# Patient Record
Sex: Male | Born: 1935
Health system: Southern US, Community
[De-identification: ages and names within clinical notes are randomized; demographics above are authoritative.]

## PROBLEM LIST (undated history)

## (undated) DIAGNOSIS — H9319 Tinnitus, unspecified ear: Secondary | ICD-10-CM

## (undated) DIAGNOSIS — C801 Malignant (primary) neoplasm, unspecified: Secondary | ICD-10-CM

## (undated) DIAGNOSIS — C61 Malignant neoplasm of prostate: Secondary | ICD-10-CM

## (undated) DIAGNOSIS — E785 Hyperlipidemia, unspecified: Secondary | ICD-10-CM

## (undated) HISTORY — DX: Malignant (primary) neoplasm, unspecified: C80.1

## (undated) HISTORY — PX: HERNIA REPAIR: SHX51

## (undated) HISTORY — DX: Malignant neoplasm of prostate: C61

## (undated) HISTORY — DX: Tinnitus, unspecified ear: H93.19

## (undated) HISTORY — DX: Hyperlipidemia, unspecified: E78.5

---
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
## 2008-12-02 ENCOUNTER — Ambulatory Visit: Payer: Self-pay | Admitting: Internal Medicine

## 2009-05-25 ENCOUNTER — Ambulatory Visit: Payer: Self-pay | Admitting: Family Medicine

## 2009-05-25 DIAGNOSIS — L989 Disorder of the skin and subcutaneous tissue, unspecified: Secondary | ICD-10-CM | POA: Insufficient documentation

## 2009-05-25 DIAGNOSIS — N400 Enlarged prostate without lower urinary tract symptoms: Secondary | ICD-10-CM | POA: Insufficient documentation

## 2009-08-13 ENCOUNTER — Ambulatory Visit (HOSPITAL_COMMUNITY): Admission: RE | Admit: 2009-08-13 | Discharge: 2009-08-13 | Payer: Self-pay

## 2009-09-23 ENCOUNTER — Encounter: Payer: Self-pay | Admitting: Family Medicine

## 2009-10-09 ENCOUNTER — Ambulatory Visit: Payer: Self-pay | Admitting: Family Medicine

## 2009-10-23 DIAGNOSIS — C61 Malignant neoplasm of prostate: Secondary | ICD-10-CM | POA: Insufficient documentation

## 2010-03-29 ENCOUNTER — Encounter: Payer: Self-pay | Admitting: Family Medicine

## 2010-05-06 ENCOUNTER — Ambulatory Visit
Admission: RE | Admit: 2010-05-06 | Discharge: 2010-05-06 | Payer: Self-pay | Source: Home / Self Care | Attending: Family Medicine | Admitting: Family Medicine

## 2010-05-06 DIAGNOSIS — M542 Cervicalgia: Secondary | ICD-10-CM | POA: Insufficient documentation

## 2010-05-06 DIAGNOSIS — M79609 Pain in unspecified limb: Secondary | ICD-10-CM | POA: Insufficient documentation

## 2010-05-09 ENCOUNTER — Encounter: Payer: Self-pay | Admitting: Urology

## 2010-05-20 NOTE — Letter (Signed)
Summary: [facility]  [facility]   Imported By: Lanelle Bal 10/21/2009 09:57:16  _____________________________________________________________________  External Attachment:    Type:   Image     Comment:   External Document  Appended Document: [facility]

## 2010-05-20 NOTE — Assessment & Plan Note (Signed)
Summary: URI   Vital Signs:  Patient profile:   77 year old male Height:      70 inches Weight:      151 pounds BMI:     21.74 O2 Sat:      97 % on Room air Temp:     98.7 degrees F oral Pulse rate:   69 / minute BP sitting:   119 / 69  (left arm) Cuff size:   regular  Vitals Entered By: Payton Spark CMA (October 09, 2009 10:16 AM)  O2 Flow:  Room air CC: Watery eyes, scratchy throat, runny nose and cough x 1 week.   Primary Care Provider:  Nani Gasser MD  CC:  Watery eyes, scratchy throat, and runny nose and cough x 1 week.Troy Salinas  History of Present Illness: 77 yo AAM presents for 1 wk of scratchy throat, watery eyes and runny nose.  He has copious clear rhinorrhea  which is now yellow.  No HA.  No fevers or chills.  Has been sneezing and cough which is dry.  No SOB but has some chest tightness.  Has not been taking anything.  Sleeping OK.  No hx of allergies. No ear pressure.  Good energy level.  No rash or GI upset.    Current Medications (verified): 1)  None  Allergies (verified): No Known Drug Allergies  Review of Systems      See HPI  Physical Exam  General:  alert, well-developed, well-nourished, and well-hydrated.   Head:  normocephalic and atraumatic.   Eyes:  eyes slightly watery conjunctiva clear Ears:  EACs patent; TMs translucent and gray with good cone of light and bony landmarks.  Nose:  boggy (almost touching) turbinates with clear rhinorrhea Mouth:  o/p mildly injected Neck:  shotty anterior cervical chain LA Lungs:  dry cough with forced expiratory wheezing nonlabored.  no rhonchi or crackles Heart:  RRR w/o M Extremities:  no LE edema Skin:  color normal.   Psych:  good eye contact, not anxious appearing, and not depressed appearing.     Impression & Recommendations:  Problem # 1:  VIRAL URI (ICD-465.9) Symptoms c/w URI x 7 days, now with some chest tightness and a slight wheeze with cough -- possibly early bronchitis. will treat with 5  days of Zithromax + Chlorpheniramine for congestion/ sneezing/ itchy watery eyes and add Omnaris sample 2 sprays per nostril once a day for post nasal drip and boggy turbinates.  Call if cough/ SOB gets worse or if not feeling better after 7 days.  Complete Medication List: 1)  Zithromax Z-pak 250 Mg Tabs (Azithromycin) .... 2 tabs by mouth x 1 day then  tab by mouth daily 2)  Omnaris 50 Mcg/act Susp (Ciclesonide) .... 2 sprays per nostril once a day  Patient Instructions: 1)  Take OTC CHLORPHENIRAMINE for the next wk for congestion. 2)  (ask pharmacist to find it for you). 3)  Use Omnaris sample 2 sprays in each nostril once a day until it runs out. 4)  Take 5 days of Zithromax to cover for bronchitis. 5)  Call if cough is not improved after 7 days. Prescriptions: ZITHROMAX Z-PAK 250 MG TABS (AZITHROMYCIN) 2 tabs by mouth x 1 day then  tab by mouth daily  #1 pack x 0   Entered and Authorized by:   Seymour Bars DO   Signed by:   Seymour Bars DO on 10/09/2009   Method used:   Electronically to  [facility]  [place] [phone]* (retail)       [address] [place], [place]  [address]       Ph: [phone] or [phone]       Fax: [phone]   RxID:   [id number]
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
## 2010-05-20 NOTE — Letter (Signed)
Summary: [facility]  [facility]   Imported By: Lanelle Bal 04/22/2010 11:22:01  _____________________________________________________________________  External Attachment:    Type:   Image     Comment:   External Document
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
## 2011-04-18 ENCOUNTER — Ambulatory Visit (INDEPENDENT_AMBULATORY_CARE_PROVIDER_SITE_OTHER): Payer: Medicare Other | Admitting: Family Medicine

## 2011-04-18 ENCOUNTER — Encounter: Payer: Self-pay | Admitting: Family Medicine

## 2011-04-18 ENCOUNTER — Ambulatory Visit
Admission: RE | Admit: 2011-04-18 | Discharge: 2011-04-18 | Disposition: A | Discharge status: Home / Self Care | Payer: BC Managed Care – PPO | Source: Ambulatory Visit | Attending: Family Medicine | Admitting: Family Medicine

## 2011-04-18 VITALS — BP 132/77 | HR 68 | Temp 97.7°F | Ht 70.0 in | Wt 158.0 lb

## 2011-04-18 DIAGNOSIS — M25552 Pain in left hip: Secondary | ICD-10-CM

## 2011-04-18 DIAGNOSIS — M25559 Pain in unspecified hip: Secondary | ICD-10-CM

## 2011-04-18 MED ORDER — CELECOXIB 200 MG PO CAPS: 200.0000 mg | ORAL_CAPSULE | Freq: Every day | ORAL | Status: DC

## 2011-04-18 NOTE — Patient Instructions (Signed)
Hip pain may take medication dHip Pain The hips join the upper legs to the lower pelvis. The bones, cartilage, tendons, and muscles of the hip joint perform a lot of work each day holding your body weight and allowing you to move around. Hip pain is a common symptom. It can range from a minor ache to severe pain on 1 or both hips. Pain may be felt on the inside of the hip joint near the groin, or the outside near the buttocks and upper thigh. There may be swelling or stiffness as well. It occurs more often when a person walks or performs activity. There are many reasons hip pain can develop. CAUSES  It is important to work with your caregiver to identify the cause since many conditions can impact the bones, cartilage, muscles, and tendons of the hips. Causes for hip pain include:  Broken (fractured) bones.   Separation of the thighbone from the hip socket (dislocation).   Torn cartilage of the hip joint.   Swelling (inflammation) of a tendon (tendonitis), the sac within the hip joint (bursitis), or a joint.   A weakening in the abdominal wall (hernia), affecting the nerves to the hip.   Arthritis in the hip joint or lining of the hip joint.   Pinched nerves in the back, hip, or upper thigh.   A bulging disc in the spine (herniated disc).   Rarely, bone infection or cancer.  DIAGNOSIS  The location of your hip pain will help your caregiver understand what may be causing the pain. A diagnosis is based on your medical history, your symptoms, results from your physical exam, and results from diagnostic tests. Diagnostic tests may include X-ray exams, a computerized magnetic scan (magnetic resonance imaging, MRI), or bone scan. TREATMENT  Treatment will depend on the cause of your hip pain. Treatment may include:  Limiting activities and resting until symptoms improve.   Crutches or other walking supports (a cane or brace).   Ice, elevation, and compression.   Physical therapy or home  exercises.   Shoe inserts or special shoes.   Losing weight.   Medications to reduce pain.   Undergoing surgery.  HOME CARE INSTRUCTIONS   Only take over-the-counter or prescription medicines for pain, discomfort, or fever as directed by your caregiver.   Put ice on the injured area:   Put ice in a plastic bag.   Place a towel between your skin and the bag.   Leave the ice on for 15 to 20 minutes at a time, 3 to 4 times a day.   Keep your leg raised (elevated) when possible to lessen swelling.   Avoid activities that cause pain.   Follow specific exercises as directed by your caregiver.   Sleep with a pillow between your legs on your most comfortable side.   Record how often you have hip pain, the location of the pain, and what it feels like. This information may be helpful to you and your caregiver.   Ask your caregiver about returning to work or sports and whether you should drive.   Follow up with your caregiver for further exams, therapy, or testing as directed.  SEEK MEDICAL CARE IF:   Your pain or swelling continues or worsens after 1 week.   You are feeling unwell or have chills.   You have increasing difficulty with walking.   You have a loss of sensation or other new symptoms.   You have questions or concerns.  SEEK IMMEDIATE MEDICAL  CARE IF:   You cannot put weight on the affected hip.   You have fallen.   You have a sudden increase in pain and swelling in your hip.   You have a fever.  MAKE SURE YOU:   Understand these instructions.   Will watch your condition.   Will get help right away if you are not doing well or get worse.  Document Released: 09/22/2009 Document Revised: 12/15/2010 Document Reviewed: 09/22/2009 [facility] Patient Information 2012 [place], [place] for 2 weeks and then use prn. May want to ice the hip for 20 minutes out of 2 hours after playing tennis.hip

## 2011-04-18 NOTE — Progress Notes (Signed)
Subjective:     Patient ID: Troy Salinas, male   DOB: 01/08/1932, 77 y.o.   MRN: [id number]  Hip Pain  The incident occurred more than 1 week ago (off and on greater than 2 years. Initial was w/exercise but now tender even when not playing.). Incident location: on the tennis court. There was no injury mechanism. The pain is present in the left hip. Quality: sornesss. The pain is at a severity of 7/10. The pain is moderate. The pain has been fluctuating since onset. Pertinent negatives include no inability to bear weight, loss of motion, loss of sensation or tingling. The symptoms are aggravated by movement and weight bearing. He has tried nothing for the symptoms.     Review of Systems  Musculoskeletal: Positive for back pain. Negative for myalgias and arthralgias.  Neurological: Negative for tingling.       Objective:   Physical Exam  Constitutional: He is oriented to person, place, and time. He appears well-developed and well-nourished.  Musculoskeletal: Normal range of motion. He exhibits no edema.  Neurological: He is alert and oriented to person, place, and time.  Skin: Skin is warm.  Psychiatric: He has a normal mood and affect.       Assessment:     L hip pain    Plan:     Ice the hip 20 minutes out of 2hrs  after working out. Do not take Alleve or Motrin w/ Celebrex.  samples of Celebrex given as well as a scrip and return in 2 weeks for follow up as needed. Xray of the L  hip also ordered

## 2011-04-19 HISTORY — PX: COLONOSCOPY: SHX174

## 2011-05-16 ENCOUNTER — Encounter: Payer: Self-pay | Admitting: Physician Assistant

## 2011-05-16 ENCOUNTER — Ambulatory Visit (INDEPENDENT_AMBULATORY_CARE_PROVIDER_SITE_OTHER): Payer: Medicare Other | Admitting: Physician Assistant

## 2011-05-16 VITALS — BP 131/63 | HR 58 | Temp 97.9°F | Ht 69.0 in | Wt 163.0 lb

## 2011-05-16 DIAGNOSIS — Z0000 Encounter for general adult medical examination without abnormal findings: Secondary | ICD-10-CM

## 2011-05-16 DIAGNOSIS — Z13220 Encounter for screening for lipoid disorders: Secondary | ICD-10-CM

## 2011-05-16 DIAGNOSIS — N400 Enlarged prostate without lower urinary tract symptoms: Secondary | ICD-10-CM

## 2011-05-16 DIAGNOSIS — Z125 Encounter for screening for malignant neoplasm of prostate: Secondary | ICD-10-CM

## 2011-05-16 DIAGNOSIS — Z23 Encounter for immunization: Secondary | ICD-10-CM

## 2011-05-16 DIAGNOSIS — Z111 Encounter for screening for respiratory tuberculosis: Secondary | ICD-10-CM

## 2011-05-16 NOTE — Patient Instructions (Signed)
Get labs drawn today for cholestrol and prostate. Will call with results. Come back on Wednesday to have PPD read. Follow up in 1 year.

## 2011-05-16 NOTE — Progress Notes (Signed)
  Subjective:    Patient ID: Troy Salinas, male    DOB: 10-04-1931, 78 y.o.   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 He denies any problems or concerns today. He has a history of hypertrophic prostate without any symptoms.  He needs a PPD. He declines pneumonia and shingles vaccine.   Review of Systems     Objective:   Physical Exam  Constitutional: He is oriented to person, place, and time. He appears well-developed and well-nourished.  HENT:  Head: Normocephalic and atraumatic.  Right Ear: External ear normal.  Left Ear: External ear normal.  Nose: Nose normal.  Mouth/Throat: Oropharynx is clear and moist.       Gross hearing intact.   Eyes: Conjunctivae and EOM are normal. Pupils are equal, round, and reactive to light.  Neck: Normal range of motion. Neck supple.  Cardiovascular: Regular rhythm, normal heart sounds and intact distal pulses.        Bradycardia at 58.  Pulmonary/Chest: Effort normal and breath sounds normal.  Abdominal: Soft. Bowel sounds are normal.  Genitourinary: Rectum normal and penis normal. Guaiac negative stool. No penile tenderness.       Enlarged smooth prostate.  Musculoskeletal: Normal range of motion.       Strength in all extremities 5/5. Able to squat and twist with out problems.   Neurological: He is alert and oriented to person, place, and time. He has normal reflexes.  Skin: Skin is warm and dry.  Psychiatric: He has a normal mood and affect. His behavior is normal.          Assessment & Plan:   V70.0-Hemmocult negative. PPD is administered. Patient will come back on Wednesday to have her. Lipid panel and PSA ordered. We'll call with results. Patient educated on importance of healthy diet and staying active.

## 2011-05-18 LAB — LIPID PANEL
HDL: 48 mg/dL (ref >39)
LDL Cholesterol: 111 mg/dL — ABNORMAL HIGH (ref 0–99)
VLDL: 22 mg/dL (ref 0–40)

## 2011-05-19 LAB — TB SKIN TEST: TB Skin Test: NEGATIVE mm

## 2011-09-20 ENCOUNTER — Ambulatory Visit (INDEPENDENT_AMBULATORY_CARE_PROVIDER_SITE_OTHER): Payer: Medicare Other | Admitting: Family Medicine

## 2011-09-20 ENCOUNTER — Encounter: Payer: Self-pay | Admitting: Family Medicine

## 2011-09-20 VITALS — BP 125/69 | HR 82 | Ht 70.0 in | Wt 155.0 lb

## 2011-09-20 DIAGNOSIS — I10 Essential (primary) hypertension: Secondary | ICD-10-CM

## 2011-09-20 DIAGNOSIS — E785 Hyperlipidemia, unspecified: Secondary | ICD-10-CM

## 2011-09-20 NOTE — Patient Instructions (Signed)

## 2011-09-20 NOTE — Progress Notes (Signed)
  Subjective:    Patient ID: Troy Salinas, male    DOB: Apr 14, 1932, 78 y.o.   MRN: [id number]  HPI  He is here today because he would like to recheck his blood pressure. He would also like to recheck his cholesterol. He is a physical back in January and his LDL was slightly elevated at 111. He has no prior history of high blood pressure or family history of coronary artery disease. He is not a smoker. He does drink a glass of wine daily. No SOB orCP.    Review of Systems     Objective:   Physical Exam  Constitutional: He is oriented to person, place, and time. He appears well-developed and well-nourished.  HENT:  Head: Normocephalic and atraumatic.  Cardiovascular: Normal rate, regular rhythm and normal heart sounds.   Pulmonary/Chest: Effort normal and breath sounds normal.  Neurological: He is alert and oriented to person, place, and time.  Skin: Skin is warm and dry.  Psychiatric: He has a normal mood and affect. His behavior is normal.          Assessment & Plan:  Hyperlpidemia -  we did discuss dietary changes to improve his cholesterol. He is overall low risk, his LDL goal is less than 1:30. I gave him a lab slip today so that he can go next few weeks to recheck his levels.Given H.O on dietary infromation. Stay active and exercise reguarly.   I gave him reassurance that his blood pressure is normal today.

## 2013-05-07 DIAGNOSIS — N4889 Other specified disorders of penis: Secondary | ICD-10-CM | POA: Diagnosis not present

## 2013-05-28 DIAGNOSIS — N471 Phimosis: Secondary | ICD-10-CM | POA: Diagnosis not present

## 2013-05-28 DIAGNOSIS — N478 Other disorders of prepuce: Secondary | ICD-10-CM | POA: Diagnosis not present

## 2013-08-01 DIAGNOSIS — H6120 Impacted cerumen, unspecified ear: Secondary | ICD-10-CM | POA: Diagnosis not present

## 2013-08-01 DIAGNOSIS — J342 Deviated nasal septum: Secondary | ICD-10-CM | POA: Diagnosis not present

## 2013-08-26 DIAGNOSIS — Z8546 Personal history of malignant neoplasm of prostate: Secondary | ICD-10-CM | POA: Diagnosis not present

## 2013-09-02 DIAGNOSIS — Z8546 Personal history of malignant neoplasm of prostate: Secondary | ICD-10-CM | POA: Diagnosis not present

## 2013-09-24 DIAGNOSIS — H40019 Open angle with borderline findings, low risk, unspecified eye: Secondary | ICD-10-CM | POA: Diagnosis not present

## 2013-09-24 DIAGNOSIS — H2510 Age-related nuclear cataract, unspecified eye: Secondary | ICD-10-CM | POA: Diagnosis not present

## 2013-09-24 DIAGNOSIS — H35349 Macular cyst, hole, or pseudohole, unspecified eye: Secondary | ICD-10-CM | POA: Diagnosis not present

## 2013-09-24 DIAGNOSIS — H3589 Other specified retinal disorders: Secondary | ICD-10-CM | POA: Diagnosis not present

## 2013-09-27 DIAGNOSIS — I440 Atrioventricular block, first degree: Secondary | ICD-10-CM | POA: Diagnosis not present

## 2013-09-27 DIAGNOSIS — I951 Orthostatic hypotension: Secondary | ICD-10-CM | POA: Diagnosis not present

## 2013-09-27 DIAGNOSIS — R55 Syncope and collapse: Secondary | ICD-10-CM | POA: Diagnosis not present

## 2013-10-17 DIAGNOSIS — C61 Malignant neoplasm of prostate: Secondary | ICD-10-CM | POA: Diagnosis not present

## 2013-10-17 DIAGNOSIS — R972 Elevated prostate specific antigen [PSA]: Secondary | ICD-10-CM | POA: Diagnosis not present

## 2013-12-31 DIAGNOSIS — C61 Malignant neoplasm of prostate: Secondary | ICD-10-CM | POA: Diagnosis not present

## 2014-01-03 DIAGNOSIS — I951 Orthostatic hypotension: Secondary | ICD-10-CM | POA: Diagnosis not present

## 2014-01-03 DIAGNOSIS — C761 Malignant neoplasm of thorax: Secondary | ICD-10-CM | POA: Diagnosis not present

## 2014-01-03 DIAGNOSIS — I440 Atrioventricular block, first degree: Secondary | ICD-10-CM | POA: Diagnosis not present

## 2014-01-03 DIAGNOSIS — R748 Abnormal levels of other serum enzymes: Secondary | ICD-10-CM | POA: Diagnosis not present

## 2014-02-21 DIAGNOSIS — Z23 Encounter for immunization: Secondary | ICD-10-CM | POA: Diagnosis not present

## 2014-02-27 DIAGNOSIS — K635 Polyp of colon: Secondary | ICD-10-CM | POA: Diagnosis not present

## 2014-02-27 DIAGNOSIS — K648 Other hemorrhoids: Secondary | ICD-10-CM | POA: Diagnosis not present

## 2014-02-27 DIAGNOSIS — Z1211 Encounter for screening for malignant neoplasm of colon: Secondary | ICD-10-CM | POA: Diagnosis not present

## 2014-02-27 DIAGNOSIS — D124 Benign neoplasm of descending colon: Secondary | ICD-10-CM | POA: Diagnosis not present

## 2014-02-27 DIAGNOSIS — D125 Benign neoplasm of sigmoid colon: Secondary | ICD-10-CM | POA: Diagnosis not present

## 2014-03-12 DIAGNOSIS — M25579 Pain in unspecified ankle and joints of unspecified foot: Secondary | ICD-10-CM | POA: Diagnosis not present

## 2014-04-30 DIAGNOSIS — C61 Malignant neoplasm of prostate: Secondary | ICD-10-CM | POA: Diagnosis not present

## 2014-06-20 DIAGNOSIS — R0982 Postnasal drip: Secondary | ICD-10-CM | POA: Diagnosis not present

## 2014-06-20 DIAGNOSIS — H9313 Tinnitus, bilateral: Secondary | ICD-10-CM | POA: Diagnosis not present

## 2014-06-20 DIAGNOSIS — H6123 Impacted cerumen, bilateral: Secondary | ICD-10-CM | POA: Diagnosis not present

## 2014-09-05 ENCOUNTER — Ambulatory Visit (INDEPENDENT_AMBULATORY_CARE_PROVIDER_SITE_OTHER): Payer: Medicare Other | Admitting: Internal Medicine

## 2014-09-05 ENCOUNTER — Encounter: Payer: Self-pay | Admitting: Internal Medicine

## 2014-09-05 VITALS — BP 144/56 | HR 53 | Ht 70.0 in | Wt 155.8 lb

## 2014-09-05 DIAGNOSIS — R0602 Shortness of breath: Secondary | ICD-10-CM | POA: Insufficient documentation

## 2014-09-05 DIAGNOSIS — I498 Other specified cardiac arrhythmias: Secondary | ICD-10-CM | POA: Diagnosis not present

## 2014-09-05 DIAGNOSIS — Z13220 Encounter for screening for lipoid disorders: Secondary | ICD-10-CM | POA: Diagnosis not present

## 2014-09-05 DIAGNOSIS — R42 Dizziness and giddiness: Secondary | ICD-10-CM

## 2014-09-05 LAB — LIPID PANEL
Cholesterol: 147 mg/dL (ref 0–200)
HDL: 46 mg/dL (ref >=40)
LDL CALC: 84 mg/dL (ref 0–99)
Total CHOL/HDL Ratio: 3.2 Ratio
Triglycerides: 85 mg/dL (ref <150)
VLDL: 17 mg/dL (ref 0–40)

## 2014-09-05 LAB — COMPREHENSIVE METABOLIC PANEL
ALT: 15 U/L (ref 0–53)
AST: 19 U/L (ref 0–37)
Albumin: 3.8 g/dL (ref 3.5–5.2)
Alkaline Phosphatase: 121 U/L — ABNORMAL HIGH (ref 39–117)
BUN: 16 mg/dL (ref 6–23)
CALCIUM: 8.5 mg/dL (ref 8.4–10.5)
CHLORIDE: 105 meq/L (ref 96–112)
CO2: 30 mEq/L (ref 19–32)
CREATININE: 0.81 mg/dL (ref 0.50–1.35)
Glucose, Bld: 81 mg/dL (ref 70–99)
Potassium: 4.2 mEq/L (ref 3.5–5.3)
Sodium: 141 mEq/L (ref 135–145)
Total Bilirubin: 1.6 mg/dL — ABNORMAL HIGH (ref 0.2–1.2)
Total Protein: 6.4 g/dL (ref 6.0–8.3)

## 2014-09-05 NOTE — Progress Notes (Signed)
OFFICE NOTE  Chief Complaint:  Lightheadedness, "pounding in my throat"  Primary Care Physician: No primary care provider on file.  HPI:  Troy Salinas is a pleasant 81 year old male who self-referred for a second opinion regarding his symptoms. He's previously seen Dr. Einar Salinas, most recently last year. He underwent an exercise stress test as well as an echocardiogram in the past. The stress test was reportedly normal. The echocardiogram report demonstrates mild LVH and normal systolic function with an EF of 57%. No comment was made on diastolic function. The left atrium was slightly enlarged and the right atrium and right ventricle were borderline enlarged. There is mild MR and TR. He has no history of hypertension although blood pressure is borderline elevated today. He says he gets symptoms such as a pulsating or pounding feeling in his throat when he gets upset. He also is fairly active and plays tennis regularly. He says he gets some lightheadedness but is felt that it is related to not eating before exercising. He also reports his heart races, again when he gets upset. In the past is been reported his EKGs demonstrated sinus rhythm with a first-degree AV block. EKG today, however, demonstrates junctional rhythm at 53. He is really not describing any cardiac chest pain with exertion.  PMHx:  Past Medical History  Diagnosis Date  . Cancer     Prostate, managed in [place]  . Hyperlipidemia     Past Surgical History  Procedure Laterality Date  . Hernia repair      FAMHx:  Family History  Problem Relation Age of Onset  . Cancer Maternal Grandmother     SOCHx:   reports that he has quit smoking. He has never used smokeless tobacco. He reports that he drinks alcohol. He reports that he does not use illicit drugs.  ALLERGIES:  No Known Allergies  ROS: A comprehensive review of systems was negative except for: Cardiovascular: positive for palpitations Neurological: positive for  Lightheadedness  HOME MEDS: Current Outpatient Prescriptions  Medication Sig Dispense Refill  . Multiple Vitamin (MULTIVITAMIN) capsule Take 1 capsule by mouth daily.     No current facility-administered medications for this visit.    LABS/IMAGING: No results found for this or any previous visit (from the past 48 hour(s)). No results found.  WEIGHTS: Wt Readings from Last 3 Encounters:  09/05/14 155 lb 12.8 oz (70.67 kg)  09/20/11 155 lb (70.308 kg)  05/16/11 163 lb (73.936 kg)    VITALS: BP 144/56 mmHg  Pulse 53  Ht 5\' 10"  (1.778 m)  Wt 155 lb 12.8 oz (70.67 kg)  BMI 22.35 kg/m2  EXAM: General appearance: alert and no distress Neck: no carotid bruit, no JVD and thyroid not enlarged, symmetric, no tenderness/mass/nodules Lungs: clear to auscultation bilaterally Heart: Regular bradycardia Abdomen: soft, non-tender; bowel sounds normal; no masses,  no organomegaly Extremities: extremities normal, atraumatic, no cyanosis or edema Pulses: 2+ and symmetric Skin: Skin color, texture, turgor normal. No rashes or lesions Neurologic: Grossly normal Psych: Pleasant  EKG: Junctional rhythm at 53  ASSESSMENT: 1. Occasional lightheadedness and history of orthostasis 2. Newly diagnosed junctional rhythm 3. Dyslipidemia 4. History of prostate cancer 5. Possible episodic hypertension  PLAN: 1.   Mr. Troy Salinas has a newly recognized junctional rhythm with heart rate in the 50s. He is not on any medications. This could explain possibly why he has intermittent fatigue and some lightheadedness. His symptoms are very sporadic therefore may be difficult to determine whether he is having any  worsening bradycardia or at what point he may need a pacemaker. Certainly, I communicated with him if he were to have any presyncope or syncopal episodes or feel worsening fatigue or decreased exercise tolerance then we may need to monitor him to determine if a pacemaker will be necessary. I do think it  would be reasonable to repeat an exercise tolerance test. It's been at least a couple years since his last test and it would be more helpful to see if he has any evidence for chronotropic incompetence. His echocardiogram is fairly reassuring showing normal systolic function with mild valvular disease. We will go ahead and recheck a lipid profile as he is not currently on therapy. Finally, his EKG shows changes of hypertensive heart disease. I believe his episodes of pounding in his throat may be related to episodic hypertension. If his blood pressure remains elevated as it was in the office today, I may recommend starting blood pressure medication such as an ACE inhibitor or ARB.  Plan to see him back in a few weeks to discuss the findings of his stress test and go over his blood work.  Pixie Casino, MD, [facility] Attending Cardiologist [place] 09/05/2014, 1:30 PM

## 2014-09-05 NOTE — Patient Instructions (Signed)
Your physician recommends that you return for lab work FASTING - first floor - [address]   Dr. Debara Pickett has ordered an exercise tolerance test.    Your physician recommends that you schedule a follow-up appointment after your tests.    Exercise Stress Electrocardiogram An exercise stress electrocardiogram is a test that is done to evaluate the blood supply to your heart. This test may also be called exercise stress electrocardiography. The test is done while you are walking on a treadmill. The goal of this test is to raise your heart rate. This test is done to find areas of poor blood flow to the heart by determining the extent of coronary artery disease (CAD).   CAD is defined as narrowing in one or more heart (coronary) arteries of more than 70%. If you have an abnormal test result, this may mean that you are not getting adequate blood flow to your heart during exercise. Additional testing may be needed to understand why your test was abnormal. LET [facility] CARE PROVIDER KNOW ABOUT:   Any allergies you have.  All medicines you are taking, including vitamins, herbs, eye drops, creams, and over-the-counter medicines.  Previous problems you or members of your family have had with the use of anesthetics.  Any blood disorders you have.  Previous surgeries you have had.  Medical conditions you have.  Possibility of pregnancy, if this applies. RISKS AND COMPLICATIONS Generally, this is a safe procedure. However, as with any procedure, complications can occur. Possible complications can include:  Pain or pressure in the following areas:  Chest.  Jaw or neck.  Between your shoulder blades.  Radiating down your left arm.  Dizziness or light-headedness.  Shortness of breath.  Increased or irregular heartbeats.  Nausea or vomiting.  Heart attack (rare). BEFORE THE PROCEDURE  Avoid all forms of caffeine 24 hours before your test or as directed by your health care provider. This  includes coffee, tea (even decaffeinated tea), caffeinated sodas, chocolate, cocoa, and certain pain medicines.  Follow your health care provider's instructions regarding eating and drinking before the test.  Take your medicines as directed at regular times with water unless instructed otherwise. Exceptions may include:  If you have diabetes, ask how you are to take your insulin or pills. It is common to adjust insulin dosing the morning of the test.  If you are taking beta-blocker medicines, it is important to talk to your health care provider about these medicines well before the date of your test. Taking beta-blocker medicines may interfere with the test. In some cases, these medicines need to be changed or stopped 24 hours or more before the test.  If you wear a nitroglycerin patch, it may need to be removed prior to the test. Ask your health care provider if the patch should be removed before the test.  If you use an inhaler for any breathing condition, bring it with you to the test.  If you are an outpatient, bring a snack so you can eat right after the stress phase of the test.  Do not smoke for 4 hours prior to the test or as directed by your health care provider.  Do not apply lotions, powders, creams, or oils on your chest prior to the test.  Wear loose-fitting clothes and comfortable shoes for the test. This test involves walking on a treadmill. PROCEDURE  Multiple patches (electrodes) will be put on your chest. If needed, small areas of your chest may have to be shaved  to get better contact with the electrodes. Once the electrodes are attached to your body, multiple wires will be attached to the electrodes and your heart rate will be monitored.  Your heart will be monitored both at rest and while exercising.  You will walk on a treadmill. The treadmill will be started at a slow pace. The treadmill speed and incline will gradually be increased to raise your heart rate. AFTER  THE PROCEDURE  Your heart rate and blood pressure will be monitored after the test.  You may return to your normal schedule including diet, activities, and medicines, unless your health care provider tells you otherwise. Document Released: 04/01/2000 Document Revised: 04/09/2013 Document Reviewed: 12/10/2012 [facility] Patient Information 2015 [place], [place]. This information is not intended to replace advice given to you by your health care provider. Make sure you discuss any questions you have with your health care provider.

## 2014-10-07 ENCOUNTER — Telehealth (HOSPITAL_COMMUNITY): Payer: Self-pay

## 2014-10-07 NOTE — Telephone Encounter (Signed)
Encounter complete. 

## 2014-10-09 ENCOUNTER — Ambulatory Visit (HOSPITAL_COMMUNITY)
Admission: RE | Admit: 2014-10-09 | Discharge: 2014-10-09 | Disposition: A | Discharge status: Home / Self Care | Payer: Medicare Other | Source: Ambulatory Visit | Attending: Internal Medicine | Admitting: Internal Medicine

## 2014-10-09 DIAGNOSIS — I498 Other specified cardiac arrhythmias: Secondary | ICD-10-CM | POA: Diagnosis not present

## 2014-10-09 DIAGNOSIS — R42 Dizziness and giddiness: Secondary | ICD-10-CM | POA: Diagnosis not present

## 2014-10-09 DIAGNOSIS — R0602 Shortness of breath: Secondary | ICD-10-CM | POA: Diagnosis not present

## 2014-10-09 LAB — EXERCISE TOLERANCE TEST
CSEPED: 10 min
CSEPEDS: 0 s
CSEPPHR: 151 {beats}/min
Estimated workload: 11.7 METS
MPHR: 140 {beats}/min
Percent HR: 107 %
RPE: 15
Rest HR: 69 {beats}/min

## 2014-11-24 DIAGNOSIS — H2512 Age-related nuclear cataract, left eye: Secondary | ICD-10-CM | POA: Diagnosis not present

## 2014-11-24 DIAGNOSIS — H40003 Preglaucoma, unspecified, bilateral: Secondary | ICD-10-CM | POA: Diagnosis not present

## 2014-11-24 DIAGNOSIS — H25011 Cortical age-related cataract, right eye: Secondary | ICD-10-CM | POA: Diagnosis not present

## 2014-11-24 DIAGNOSIS — H2511 Age-related nuclear cataract, right eye: Secondary | ICD-10-CM | POA: Diagnosis not present

## 2014-11-26 ENCOUNTER — Ambulatory Visit (INDEPENDENT_AMBULATORY_CARE_PROVIDER_SITE_OTHER): Payer: Medicare Other | Admitting: Family

## 2014-11-26 ENCOUNTER — Encounter: Payer: Self-pay | Admitting: Family

## 2014-11-26 VITALS — BP 150/74 | HR 52 | Temp 97.9°F | Resp 18 | Ht 70.0 in | Wt 151.0 lb

## 2014-11-26 DIAGNOSIS — L988 Other specified disorders of the skin and subcutaneous tissue: Secondary | ICD-10-CM | POA: Diagnosis not present

## 2014-11-26 DIAGNOSIS — H269 Unspecified cataract: Secondary | ICD-10-CM | POA: Insufficient documentation

## 2014-11-26 NOTE — Progress Notes (Addendum)
Subjective:    Patient ID: Troy Salinas, male    DOB: August 07, 1931, 81 y.o.   MRN: [id number]  Chief Complaint  Patient presents with  . Establish Care    spots on skin    HPI:  Troy Salinas is a 81 y.o. male with a PMH of prostate cancer and hyperlipidemia who presents today for an office visit to establish care.    1.) Skin - Associated symptom of spots located on his skin around his body and head, neck and arm have been going on for quite a while. Notes that he perspires a lot while playing tennis. Described as similar to white/blackheads. Modifying factors include lancing them and having some white discharge. There is no pain associated with it.   2.) Eyes - Associated symptom of eye dryness located in both eyes has been going on for a while. Denies any changes in vision. Symptoms are worse at night. Has been told in the past that he has cataracts and possible glaucoma.   No Known Allergies   Outpatient Prescriptions Prior to Visit  Medication Sig Dispense Refill  . Multiple Vitamin (MULTIVITAMIN) capsule Take 1 capsule by mouth daily.     No facility-administered medications prior to visit.     Past Medical History  Diagnosis Date  . Cancer     Prostate, managed in [place]  . Hyperlipidemia      Past Surgical History  Procedure Laterality Date  . Hernia repair       Family History  Problem Relation Age of Onset  . Cancer Maternal Grandmother   . Healthy Mother   . Cataracts Mother   . Cancer Sister   . Cancer Brother   . Healthy Maternal Grandfather   . Healthy Paternal Grandmother   . Healthy Paternal Grandfather     Social History   Social History  . Marital Status: Widowed    Spouse Name: N/A  . Number of Children: 1  . Years of Education: 16   Occupational History  . Retired    Social History Main Topics  . Smoking status: Former Research scientist (life sciences)  . Smokeless tobacco: Never Used  . Alcohol Use: 0.0 oz/week    0 Standard drinks or equivalent per week      Comment: social use  . Drug Use: No  . Sexual Activity: Not on file   Other Topics Concern  . Not on file   Social History Narrative   Fun: Plays tennis   Feels safe at home and denies abuse    Review of Systems  Constitutional: Negative for fever and chills.  Eyes: Negative for photophobia, pain, discharge, redness, itching and visual disturbance.  Skin: Positive for rash. Negative for color change.  Neurological: Negative for headaches.      Objective:    BP 150/74 mmHg  Pulse 52  Temp(Src) 97.9 F (36.6 C) (Oral)  Resp 18  Ht 5\' 10"  (1.778 m)  Wt 151 lb (68.493 kg)  BMI 21.67 kg/m2  SpO2 98% Nursing note and vital signs reviewed.  Physical Exam  Constitutional: He is oriented to person, place, and time. He appears well-developed and well-nourished. No distress.  Eyes:  Fundoscopic exam:      The right eye shows no exudate, no hemorrhage and no papilledema. The right eye shows red reflex.       The left eye shows no exudate, no hemorrhage and no papilledema. The left eye shows red reflex.  Cardiovascular: Normal rate, regular rhythm, normal heart  sounds and intact distal pulses.   Pulmonary/Chest: Effort normal and breath sounds normal.  Neurological: He is alert and oriented to person, place, and time.  Skin: Skin is warm and dry.     Psychiatric: He has a normal mood and affect. His behavior is normal. Judgment and thought content normal.       Assessment & Plan:   Problem List Items Addressed This Visit      Musculoskeletal and Integument   Skin macule    Patches appear as potential solar lentigines, however cannot rule out underlying melanoma although appearance makes that seem unlikely. Refer to dermatology for confirmation. Recommend hat and sun protection when outdoors.           Other   Cataracts, bilateral - Primary    Previously diagnosed with cataracts and questionable glaucoma. Would like to be referred for potential glaucoma. Referral to  opthalmology placed.       Relevant Orders   Ambulatory referral to Ophthalmology

## 2014-11-26 NOTE — Assessment & Plan Note (Signed)
Previously diagnosed with cataracts and questionable glaucoma. Would like to be referred for potential glaucoma. Referral to opthalmology placed.

## 2014-11-26 NOTE — Progress Notes (Signed)
Pre visit review using our clinic review tool, if applicable. No additional management support is needed unless otherwise documented below in the visit note. 

## 2014-11-26 NOTE — Progress Notes (Deleted)
Subjective:    Patient ID: Troy Salinas, male    DOB: 08/16/1931, 81 y.o.   MRN: [id number]  Chief Complaint  Patient presents with  . Establish Care    CPE, fasting     HPI:  Troy Salinas is a 81 y.o. male who presents today for an annual wellness visit.   1) Health Maintenance -   Diet -  Exercise -  2) Preventative Exams / Immunizations:  Dental -- Vision --   Health Maintenance  Topic Date Due  . PNA vac Low Risk Adult (1 of 2 - PCV13) 02/07/1999  . INFLUENZA VACCINE  11/17/2014  . COLONOSCOPY  11/12/2017  . TETANUS/TDAP  04/19/2019  . ZOSTAVAX  Addressed     Immunization History  Administered Date(s) Administered  . Influenza Whole 01/16/2009  . PPD Test 05/16/2011  . Td 04/18/2009     RISK FACTORS  Tobacco History  Smoking status  . Former Smoker  Smokeless tobacco  . Never Used     Cardiac risk factors: {risk factors:510}.  Depression Screen  Q1: Over the past two weeks, have you felt down, depressed or hopeless? No  Q2: Over the past two weeks, have you felt little interest or pleasure in doing things? No  Have you lost interest or pleasure in daily life? No  Do you often feel hopeless? No  Do you cry easily over simple problems? No  Activities of Daily Living In your present state of health, do you have any difficulty performing the following activities?:  Driving? No Managing money?  No Feeding yourself? No Getting from bed to chair? No Climbing a flight of stairs? No Preparing food and eating?: No Bathing or showering? No Getting dressed: No Getting to the toilet? No Using the toilet: No Moving around from place to place: No In the past year have you fallen or had a near fall?:No   Home Safety Has smoke detector and wears seat belts. No firearms. No excess sun exposure. Are there smokers in your home (other than you)?  No Do you feel safe at home?  Yes  Hearing Difficulties: No Do you often ask people to speak up or  repeat themselves? No Do you experience ringing or noises in your ears? No  Do you have difficulty understanding soft or whispered voices? No    Cognitive Testing  Alert? Yes   Normal Appearance? Yes  Oriented to person? Yes  Place? Yes   Time? Yes  Recall of three objects?  Yes  Can perform simple calculations? Yes  Displays appropriate judgment? Yes  Can read the correct time from a watch face? Yes  Do you feel that you have a problem with memory? No  Do you often misplace items? No   Advanced Directives have been discussed with the patient? {yes/no:20286}  Current Physicians/Providers and Suppliers  1.   Indicate any recent Medical Services you may have received from other than [facility] providers in the past year (date may be approximate).  All answers were reviewed with the patient and necessary referrals were made:  Mauricio Po, [place]   11/26/2014    Review of Systems  Constitutional: Denies fever, chills, fatigue, or significant weight gain/loss. HENT: Head: Denies headache or neck pain Ears: Denies changes in hearing, ringing in ears, earache, drainage Nose: Denies discharge, stuffiness, itching, nosebleed, sinus pain Throat: Denies sore throat, hoarseness, dry mouth, sores, thrush Eyes: Denies loss/changes in vision, pain, redness, blurry/double vision, flashing lights Cardiovascular: Denies chest pain/discomfort, tightness,  palpitations, shortness of breath with activity, difficulty lying down, swelling, sudden awakening with shortness of breath Respiratory: Denies shortness of breath, cough, sputum production, wheezing Gastrointestinal: Denies dysphasia, heartburn, change in appetite, nausea, change in bowel habits, rectal bleeding, constipation, diarrhea, yellow skin or eyes Genitourinary: Denies frequency, urgency, burning/pain, blood in urine, incontinence, change in urinary strength. Musculoskeletal: Denies muscle/joint pain, stiffness, back pain, redness or  swelling of joints, trauma Skin: Denies rashes, lumps, itching, dryness, color changes, or hair/nail changes Neurological: Denies dizziness, fainting, seizures, weakness, numbness, tingling, tremor Psychiatric - Denies nervousness, stress, depression or memory loss Endocrine: Denies heat or cold intolerance, sweating, frequent urination, excessive thirst, changes in appetite Hematologic: Denies ease of bruising or bleeding    Objective:     BP 150/74 mmHg  Pulse 52  Temp(Src) 97.9 F (36.6 C) (Oral)  Resp 18  Ht 5\' 10"  (1.778 m)  Wt 151 lb (68.493 kg)  BMI 21.67 kg/m2  SpO2 98% Nursing note and vital signs reviewed.  Physical Exam     Assessment & Plan:   During the course of the visit the patient was educated and counseled about appropriate screening and preventive services including:    {plan:19837}  Diet review for nutrition referral? Yes ____  Not Indicated _X___   Patient Instructions (the written plan) was given to the patient.  Medicare Attestation I have personally reviewed: The patient's medical and social history Their use of alcohol, tobacco or illicit drugs Their current medications and supplements The patient's functional ability including ADLs,fall risks, home safety risks, cognitive, and hearing and visual impairment Diet and physical activities Evidence for depression or mood disorders  The patient's weight, height, BMI,  have been recorded in the chart.  I have made referrals, counseling, and provided education to the patient based on review of the above and I have provided the patient with a written personalized care plan for preventive services.     Mauricio Po, [place]   11/26/2014

## 2014-11-26 NOTE — Patient Instructions (Signed)
Thank you for choosing [facility].  Summary/Instructions:  Please follow up with opthalmology and dermatology as discussed.  If your symptoms worsen or fail to improve, please contact our office for further instruction, or in case of emergency go directly to the emergency room at the closest medical facility.    Glaucoma Glaucoma is a condition of high pressure inside the eyes. There are several forms of glaucoma. The pressure in the eye is raised because fluid (aqueous) within the eye can not get out through the normal drainage system. Below are the different forms of glaucoma.  Chronic open-angle glaucoma. This is when the fluid in the eye cannot get through the drainage system. It usually affects both eyes. The best way to understand chronic open angle glaucoma is to think of the tires on your car. When they are overinflated with air, the driver is not aware that there is a problem. However, the rubber on the tires is being worn out faster than normal. Similarly, chronic high pressure in the eyes causes the nerves of the retina and optic nerve to be damaged. When this happens slowly and over time, there are no symptoms.  Closed angle glaucoma or acute angle closure glaucoma. This is when the fluid in the eye cannot get to the drainage system. The pressure in the eye rises very high (3 to 4 times normal). This causes extreme pain and vision loss in the affected eye. Attacks are sudden and often happen in one eye at a time. However, they can happen in both at the same time. If there is an attack of acute angle closure in one eye, the risk of an attack in the other eye is very high.  Primary glaucoma. This term is used when glaucoma is not caused by another disease. CAUSES  Chronic Open Angle Glaucoma This form is not usually related to other diseases (primary). However, there are forms of this type of glaucoma that are related to other diseases (secondary) including:  Congenital  glaucoma. This means that you are born with the disease.  Certain diseases of the eye (inflammation, some types of cataracts and lens changes, having too much pigment in the eyes, certain diseases of the cornea and tumors of the eye).  Certain drugs and eye drops.  Traumatic injuries.  Abnormal blood vessels that form as a result of other diseases, especially diabetes.  Certain other diseases in the body. Acute Angle Closure Glaucoma   Farsightedness (hyperopia). The eyes are usually shorter in length and have a narrower anatomic opening to the drainage system inside the eye.  Drugs that cause dilation of the pupils as a side affect. Always check the label of drugs for warnings about the risk of glaucoma with their use.  Certain eye drops that can enlarge (dilate) the pupils. This is true even with drops used in the doctor's office to look at your eyes. SYMPTOMS  Chronic Open Angle Glaucoma Early in the disease there are no symptoms at all. If left untreated, the disease will get worse. The following symptoms may happen:   Loss of side vision - usually near the nose. This symptom is rarely noticed by people with chronic open angle glaucoma. By the time this happens, the disease is usually well advanced.  Steady loss of side vision in all directions progressing to "tunnel vision." This is like looking through a toilet roll tube and only being able to see what is in the center of whatever you are looking at.  Eventual  total loss of vision in the affected eye(s). Acute Angle Closure Glaucoma  Severe pain in the affected eye associated with clouded vision.  Severe headache in the area around the eye.  Feeling sick to your stomach (nausea) and throwing up (vomiting). DIAGNOSIS  Primary Chronic Open Angle Glaucoma  This form of glaucoma has no symptoms until very late in the disease. Therefore, it is detected only during an eye exam by an eye specialist. It is important to have your  eyes looked at by a specialist at least once a year after the age of 67. If the pressure in the eye is high, it does not necessarily mean that there is glaucoma. In such cases, an ophthalmologist may choose to follow the eye pressures carefully over a period of time. They may not begin treatment until other signs of actual damage appear.  Some of the secondary open angle glaucomas do cause symptoms. In congenital glaucoma for instance, newborn babies' eyes can be enlarged. The baby may cry all of the time due to the pain of the increased pressure.  Acute Angle Closure Glaucoma  People with this type of glaucoma need to get help right away when the attack comes on because it is so painful. The diagnosis is made by an eye exam by an ophthalmologist. He or she will measure the pressure in the affected eye(s). TREATMENT  Primary Chronic Open Angle Glaucoma   Eye drops and medicine by mouth (oral), alone or in combination, may be given. This depends on how bad the disease is and the degree of damage.  Laser treatments may help in more severe cases.  Surgery may be needed. Acute Angle Closure Glaucoma is treated very aggressively. The longer the attack lasts, the greater the chance of permanent vision loss.   Generally, it is important to get the pressure under control within 24 hours. This is to avoid permanent damage to the affected eye.  Powerful eye drop medication may be used as often as every 10 minutes.  Medicine given by mouth, injection, or through the vein (intravenously). These medicines are used to both open the blockage to the eye's drainage system and to lessen the production of the fluid inside the eye.  After pressure is controlled and to avoid future attacks in both the same eye and the unaffected eye, surgery is needed to make a permanent small opening in the colored portion of the eye (iris). This allows the aqueous fluid to have lasting access to the eye's internal drainage system.  This surgery can be done in the hospital, or it can be performed in an outpatient setting using a laser. SEEK MEDICAL CARE IF:   You are over 55 years of age and have never had your eye pressure measured.  You have sudden, severe pain in one eye associated with drop in vision.  You have a headache, feel sick to your stomach (nausea), and throw up (vomit). SEEK IMMEDIATE MEDICAL CARE IF:  You develop severe pain in the affected eye.  You have problems with your vision.  You have a bad headache in the area around the eye.  You develop nausea and vomiting.  You have the same or similar symptoms in the other eye. Document Released: 04/04/2005 Document Revised: 08/19/2013 Document Reviewed: 11/22/2007 [facility] Patient Information 2015 [place], [place]. This information is not intended to replace advice given to you by your health care provider. Make sure you discuss any questions you have with your health care provider.

## 2014-11-26 NOTE — Assessment & Plan Note (Signed)
Patches appear as potential solar lentigines, however cannot rule out underlying melanoma although appearance makes that seem unlikely. Refer to dermatology for confirmation. Recommend hat and sun protection when outdoors.

## 2014-11-28 DIAGNOSIS — B351 Tinea unguium: Secondary | ICD-10-CM | POA: Diagnosis not present

## 2014-11-28 DIAGNOSIS — L821 Other seborrheic keratosis: Secondary | ICD-10-CM | POA: Diagnosis not present

## 2014-12-02 DIAGNOSIS — H4011X2 Primary open-angle glaucoma, moderate stage: Secondary | ICD-10-CM | POA: Diagnosis not present

## 2014-12-08 DIAGNOSIS — H2513 Age-related nuclear cataract, bilateral: Secondary | ICD-10-CM | POA: Diagnosis not present

## 2014-12-08 DIAGNOSIS — H4011X0 Primary open-angle glaucoma, stage unspecified: Secondary | ICD-10-CM | POA: Diagnosis not present

## 2014-12-08 DIAGNOSIS — H43811 Vitreous degeneration, right eye: Secondary | ICD-10-CM | POA: Diagnosis not present

## 2014-12-08 DIAGNOSIS — H18413 Arcus senilis, bilateral: Secondary | ICD-10-CM | POA: Diagnosis not present

## 2014-12-15 DIAGNOSIS — R312 Other microscopic hematuria: Secondary | ICD-10-CM | POA: Diagnosis not present

## 2014-12-15 DIAGNOSIS — C61 Malignant neoplasm of prostate: Secondary | ICD-10-CM | POA: Diagnosis not present

## 2014-12-15 DIAGNOSIS — R339 Retention of urine, unspecified: Secondary | ICD-10-CM | POA: Diagnosis not present

## 2014-12-17 DIAGNOSIS — H4011X1 Primary open-angle glaucoma, mild stage: Secondary | ICD-10-CM | POA: Diagnosis not present

## 2014-12-17 DIAGNOSIS — H35341 Macular cyst, hole, or pseudohole, right eye: Secondary | ICD-10-CM | POA: Diagnosis not present

## 2014-12-19 DIAGNOSIS — R319 Hematuria, unspecified: Secondary | ICD-10-CM | POA: Diagnosis not present

## 2014-12-19 DIAGNOSIS — R312 Other microscopic hematuria: Secondary | ICD-10-CM | POA: Diagnosis not present

## 2014-12-19 DIAGNOSIS — Z8546 Personal history of malignant neoplasm of prostate: Secondary | ICD-10-CM | POA: Diagnosis not present

## 2014-12-25 DIAGNOSIS — N3289 Other specified disorders of bladder: Secondary | ICD-10-CM | POA: Diagnosis not present

## 2014-12-25 DIAGNOSIS — R312 Other microscopic hematuria: Secondary | ICD-10-CM | POA: Diagnosis not present

## 2015-01-19 DIAGNOSIS — R310 Gross hematuria: Secondary | ICD-10-CM | POA: Diagnosis not present

## 2015-01-19 DIAGNOSIS — Z8546 Personal history of malignant neoplasm of prostate: Secondary | ICD-10-CM | POA: Diagnosis not present

## 2015-02-04 DIAGNOSIS — Z23 Encounter for immunization: Secondary | ICD-10-CM | POA: Diagnosis not present

## 2015-03-02 DIAGNOSIS — G5792 Unspecified mononeuropathy of left lower limb: Secondary | ICD-10-CM | POA: Diagnosis not present

## 2015-03-02 DIAGNOSIS — Z7689 Persons encountering health services in other specified circumstances: Secondary | ICD-10-CM | POA: Diagnosis not present

## 2015-07-03 DIAGNOSIS — B351 Tinea unguium: Secondary | ICD-10-CM | POA: Diagnosis not present

## 2015-07-03 DIAGNOSIS — Z0000 Encounter for general adult medical examination without abnormal findings: Secondary | ICD-10-CM | POA: Diagnosis not present

## 2015-07-03 DIAGNOSIS — Z8546 Personal history of malignant neoplasm of prostate: Secondary | ICD-10-CM | POA: Diagnosis not present

## 2015-07-03 DIAGNOSIS — I495 Sick sinus syndrome: Secondary | ICD-10-CM | POA: Diagnosis not present

## 2015-07-03 DIAGNOSIS — N529 Male erectile dysfunction, unspecified: Secondary | ICD-10-CM | POA: Diagnosis not present

## 2015-07-03 DIAGNOSIS — K644 Residual hemorrhoidal skin tags: Secondary | ICD-10-CM | POA: Diagnosis not present

## 2015-07-03 DIAGNOSIS — Z23 Encounter for immunization: Secondary | ICD-10-CM | POA: Diagnosis not present

## 2015-07-03 DIAGNOSIS — E782 Mixed hyperlipidemia: Secondary | ICD-10-CM | POA: Diagnosis not present

## 2015-08-10 DIAGNOSIS — Z8546 Personal history of malignant neoplasm of prostate: Secondary | ICD-10-CM | POA: Diagnosis not present

## 2015-08-10 DIAGNOSIS — Z0000 Encounter for general adult medical examination without abnormal findings: Secondary | ICD-10-CM | POA: Diagnosis not present

## 2015-09-07 DIAGNOSIS — H401132 Primary open-angle glaucoma, bilateral, moderate stage: Secondary | ICD-10-CM | POA: Diagnosis not present

## 2016-01-11 DIAGNOSIS — Z23 Encounter for immunization: Secondary | ICD-10-CM | POA: Diagnosis not present

## 2016-03-24 DIAGNOSIS — H2513 Age-related nuclear cataract, bilateral: Secondary | ICD-10-CM | POA: Diagnosis not present

## 2016-03-24 DIAGNOSIS — H401132 Primary open-angle glaucoma, bilateral, moderate stage: Secondary | ICD-10-CM | POA: Diagnosis not present

## 2016-04-25 DIAGNOSIS — Z7689 Persons encountering health services in other specified circumstances: Secondary | ICD-10-CM | POA: Diagnosis not present

## 2016-04-25 DIAGNOSIS — R591 Generalized enlarged lymph nodes: Secondary | ICD-10-CM | POA: Diagnosis not present

## 2016-04-25 DIAGNOSIS — R8299 Other abnormal findings in urine: Secondary | ICD-10-CM | POA: Diagnosis not present

## 2016-06-15 DIAGNOSIS — H524 Presbyopia: Secondary | ICD-10-CM | POA: Diagnosis not present

## 2016-06-15 DIAGNOSIS — H35371 Puckering of macula, right eye: Secondary | ICD-10-CM | POA: Diagnosis not present

## 2016-06-15 DIAGNOSIS — H401132 Primary open-angle glaucoma, bilateral, moderate stage: Secondary | ICD-10-CM | POA: Diagnosis not present

## 2016-07-01 ENCOUNTER — Emergency Department (HOSPITAL_COMMUNITY)
Admission: EM | Admit: 2016-07-01 | Discharge: 2016-07-01 | Disposition: A | Discharge status: Home / Self Care | Payer: Medicare HMO | Attending: Emergency Medicine | Admitting: Emergency Medicine

## 2016-07-01 ENCOUNTER — Encounter (HOSPITAL_COMMUNITY): Payer: Self-pay

## 2016-07-01 DIAGNOSIS — S161XXA Strain of muscle, fascia and tendon at neck level, initial encounter: Secondary | ICD-10-CM | POA: Insufficient documentation

## 2016-07-01 DIAGNOSIS — T148XXA Other injury of unspecified body region, initial encounter: Secondary | ICD-10-CM | POA: Diagnosis not present

## 2016-07-01 DIAGNOSIS — Y939 Activity, unspecified: Secondary | ICD-10-CM | POA: Diagnosis not present

## 2016-07-01 DIAGNOSIS — Z87891 Personal history of nicotine dependence: Secondary | ICD-10-CM | POA: Insufficient documentation

## 2016-07-01 DIAGNOSIS — Y92410 Unspecified street and highway as the place of occurrence of the external cause: Secondary | ICD-10-CM | POA: Diagnosis not present

## 2016-07-01 DIAGNOSIS — S199XXA Unspecified injury of neck, initial encounter: Secondary | ICD-10-CM | POA: Diagnosis present

## 2016-07-01 DIAGNOSIS — Y999 Unspecified external cause status: Secondary | ICD-10-CM | POA: Insufficient documentation

## 2016-07-01 DIAGNOSIS — M542 Cervicalgia: Secondary | ICD-10-CM | POA: Diagnosis not present

## 2016-07-01 DIAGNOSIS — Z8546 Personal history of malignant neoplasm of prostate: Secondary | ICD-10-CM | POA: Insufficient documentation
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
## 2016-07-01 NOTE — Discharge Instructions (Signed)
If you were given medicines take as directed.  If you are on coumadin or contraceptives realize their levels and effectiveness is altered by many different medicines.  If you have any reaction (rash, tongues swelling, other) to the medicines stop taking and see a physician.    If your blood pressure was elevated in the ER make sure you follow up for management with a primary doctor or return for chest pain, shortness of breath or stroke symptoms.  Please follow up as directed and return to the ER or see a physician for new or worsening symptoms.  Thank you. Vitals:   07/01/16 1825 07/01/16 1828 07/01/16 1900 07/01/16 1915  BP:  135/84 111/80 (!) 115/93  Pulse:  (!) 106 (!) 105 (!) 103  Resp:   17 17  Temp:  98 F (36.7 C)    TempSrc:  Oral    SpO2:  100% 99% 97%  Weight: 160 lb (72.6 kg)     Height: 5\' 10"  (1.778 m)
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
## 2016-07-03 ENCOUNTER — Ambulatory Visit (HOSPITAL_COMMUNITY)
Admission: EM | Admit: 2016-07-03 | Discharge: 2016-07-03 | Disposition: A | Discharge status: Home / Self Care | Payer: Self-pay | Attending: Family Medicine | Admitting: Family Medicine

## 2016-07-03 ENCOUNTER — Encounter (HOSPITAL_COMMUNITY): Payer: Self-pay | Admitting: *Deleted

## 2016-07-03 DIAGNOSIS — M542 Cervicalgia: Secondary | ICD-10-CM
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
## 2016-07-04 ENCOUNTER — Emergency Department (HOSPITAL_COMMUNITY): Payer: Medicare HMO

## 2016-07-04 ENCOUNTER — Emergency Department (HOSPITAL_COMMUNITY)
Admission: EM | Admit: 2016-07-04 | Discharge: 2016-07-04 | Disposition: A | Discharge status: Home / Self Care | Payer: Medicare HMO | Attending: Emergency Medicine | Admitting: Emergency Medicine

## 2016-07-04 ENCOUNTER — Encounter (HOSPITAL_COMMUNITY): Payer: Self-pay | Admitting: Emergency Medicine

## 2016-07-04 DIAGNOSIS — Y92410 Unspecified street and highway as the place of occurrence of the external cause: Secondary | ICD-10-CM | POA: Diagnosis not present

## 2016-07-04 DIAGNOSIS — S161XXA Strain of muscle, fascia and tendon at neck level, initial encounter: Secondary | ICD-10-CM | POA: Diagnosis not present

## 2016-07-04 DIAGNOSIS — Z8546 Personal history of malignant neoplasm of prostate: Secondary | ICD-10-CM | POA: Diagnosis not present

## 2016-07-04 DIAGNOSIS — S199XXA Unspecified injury of neck, initial encounter: Secondary | ICD-10-CM | POA: Diagnosis present

## 2016-07-04 DIAGNOSIS — Y999 Unspecified external cause status: Secondary | ICD-10-CM | POA: Insufficient documentation

## 2016-07-04 DIAGNOSIS — Y939 Activity, unspecified: Secondary | ICD-10-CM | POA: Diagnosis not present

## 2016-07-04 DIAGNOSIS — M542 Cervicalgia: Secondary | ICD-10-CM | POA: Diagnosis not present

## 2016-07-04 DIAGNOSIS — Z87891 Personal history of nicotine dependence: Secondary | ICD-10-CM | POA: Diagnosis not present

## 2016-07-04 MED ORDER — DIAZEPAM 5 MG PO TABS: 5.0000 mg | ORAL_TABLET | Freq: Two times a day (BID) | ORAL | 0 refills | Status: DC

## 2016-07-04 MED ORDER — DIAZEPAM 5 MG PO TABS
5.0000 mg | ORAL_TABLET | Freq: Once | ORAL | Status: AC
  Administered 2016-07-04: 5 mg via ORAL
  Filled 2016-07-04: qty 1

## 2016-07-04 MED ORDER — HYDROCODONE-ACETAMINOPHEN 5-325 MG PO TABS: 1.0000 | ORAL_TABLET | ORAL | 0 refills | Status: DC | PRN

## 2016-07-04 NOTE — ED Notes (Signed)
Pt back from CT. No distress observed. 

## 2016-07-04 NOTE — ED Notes (Signed)
PA at bedside.
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
## 2016-07-04 NOTE — Discharge Instructions (Signed)
Return if any problems. See your Physician for recheck in

## 2016-07-04 NOTE — ED Notes (Signed)
Patient transported to CT 
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
## 2016-07-10 ENCOUNTER — Encounter (HOSPITAL_COMMUNITY): Payer: Self-pay | Admitting: Emergency Medicine

## 2016-07-10 ENCOUNTER — Ambulatory Visit (HOSPITAL_COMMUNITY)
Admission: EM | Admit: 2016-07-10 | Discharge: 2016-07-10 | Disposition: A | Discharge status: Home / Self Care | Payer: Medicare HMO | Attending: Family Medicine | Admitting: Family Medicine

## 2016-07-10 DIAGNOSIS — S161XXA Strain of muscle, fascia and tendon at neck level, initial encounter: Secondary | ICD-10-CM

## 2016-07-10 MED ORDER — PREDNISONE 20 MG PO TABS: ORAL_TABLET | ORAL | 0 refills | Status: DC
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
## 2016-07-12 DIAGNOSIS — Z6823 Body mass index (BMI) 23.0-23.9, adult: Secondary | ICD-10-CM | POA: Diagnosis not present

## 2016-07-12 DIAGNOSIS — H6123 Impacted cerumen, bilateral: Secondary | ICD-10-CM | POA: Diagnosis not present

## 2016-07-12 DIAGNOSIS — H4089 Other specified glaucoma: Secondary | ICD-10-CM | POA: Diagnosis not present

## 2016-07-12 DIAGNOSIS — Z0000 Encounter for general adult medical examination without abnormal findings: Secondary | ICD-10-CM | POA: Diagnosis not present

## 2016-07-12 DIAGNOSIS — C61 Malignant neoplasm of prostate: Secondary | ICD-10-CM | POA: Diagnosis not present

## 2016-07-12 DIAGNOSIS — R8299 Other abnormal findings in urine: Secondary | ICD-10-CM | POA: Diagnosis not present

## 2016-07-22 DIAGNOSIS — Z6822 Body mass index (BMI) 22.0-22.9, adult: Secondary | ICD-10-CM | POA: Diagnosis not present

## 2016-07-22 DIAGNOSIS — L603 Nail dystrophy: Secondary | ICD-10-CM | POA: Diagnosis not present

## 2016-07-25 DIAGNOSIS — M47812 Spondylosis without myelopathy or radiculopathy, cervical region: Secondary | ICD-10-CM | POA: Diagnosis not present

## 2016-07-25 DIAGNOSIS — M9901 Segmental and somatic dysfunction of cervical region: Secondary | ICD-10-CM | POA: Diagnosis not present

## 2016-07-25 DIAGNOSIS — M5030 Other cervical disc degeneration, unspecified cervical region: Secondary | ICD-10-CM | POA: Diagnosis not present

## 2016-07-25 DIAGNOSIS — M542 Cervicalgia: Secondary | ICD-10-CM | POA: Diagnosis not present

## 2016-07-26 DIAGNOSIS — M542 Cervicalgia: Secondary | ICD-10-CM | POA: Diagnosis not present

## 2016-07-26 DIAGNOSIS — M9901 Segmental and somatic dysfunction of cervical region: Secondary | ICD-10-CM | POA: Diagnosis not present

## 2016-07-26 DIAGNOSIS — M47812 Spondylosis without myelopathy or radiculopathy, cervical region: Secondary | ICD-10-CM | POA: Diagnosis not present

## 2016-07-26 DIAGNOSIS — M5030 Other cervical disc degeneration, unspecified cervical region: Secondary | ICD-10-CM | POA: Diagnosis not present

## 2016-07-27 DIAGNOSIS — M47812 Spondylosis without myelopathy or radiculopathy, cervical region: Secondary | ICD-10-CM | POA: Diagnosis not present

## 2016-07-27 DIAGNOSIS — M5030 Other cervical disc degeneration, unspecified cervical region: Secondary | ICD-10-CM | POA: Diagnosis not present

## 2016-07-27 DIAGNOSIS — M9901 Segmental and somatic dysfunction of cervical region: Secondary | ICD-10-CM | POA: Diagnosis not present

## 2016-07-27 DIAGNOSIS — M542 Cervicalgia: Secondary | ICD-10-CM | POA: Diagnosis not present

## 2016-07-28 DIAGNOSIS — M47812 Spondylosis without myelopathy or radiculopathy, cervical region: Secondary | ICD-10-CM | POA: Diagnosis not present

## 2016-07-28 DIAGNOSIS — M9901 Segmental and somatic dysfunction of cervical region: Secondary | ICD-10-CM | POA: Diagnosis not present

## 2016-07-28 DIAGNOSIS — M542 Cervicalgia: Secondary | ICD-10-CM | POA: Diagnosis not present

## 2016-07-28 DIAGNOSIS — M5030 Other cervical disc degeneration, unspecified cervical region: Secondary | ICD-10-CM | POA: Diagnosis not present

## 2016-08-01 ENCOUNTER — Ambulatory Visit (HOSPITAL_COMMUNITY)
Admission: EM | Admit: 2016-08-01 | Discharge: 2016-08-01 | Disposition: A | Discharge status: Home / Self Care | Payer: Medicare HMO | Attending: Internal Medicine | Admitting: Internal Medicine

## 2016-08-01 ENCOUNTER — Encounter (HOSPITAL_COMMUNITY): Payer: Self-pay | Admitting: Emergency Medicine

## 2016-08-01 DIAGNOSIS — S30822A Blister (nonthermal) of penis, initial encounter: Secondary | ICD-10-CM

## 2016-08-01 MED ORDER — DOXYCYCLINE HYCLATE 100 MG PO CAPS: 100.0000 mg | ORAL_CAPSULE | Freq: Two times a day (BID) | ORAL | 0 refills | Status: DC

## 2016-08-01 MED ORDER — CLOTRIMAZOLE 1 % EX CREA
TOPICAL_CREAM | CUTANEOUS | 0 refills | Status: DC
Start: 2016-08-01 — End: 2017-01-30

## 2016-08-01 NOTE — ED Triage Notes (Signed)
Blister on penis for one week, no history of this

## 2016-08-01 NOTE — ED Provider Notes (Signed)
CSN: [id number]     Arrival date & time 08/01/16  1016 History   None    Chief Complaint  Patient presents with  . Blister   (Consider location/radiation/quality/duration/timing/severity/associated sxs/prior Treatment) The history is provided by the patient. No language interpreter was used.  Penile Discharge  This is a new problem. The current episode started more than 1 week ago. The problem occurs constantly. The problem has been gradually worsening. Nothing aggravates the symptoms. Nothing relieves the symptoms. He has tried nothing for the symptoms. The treatment provided no relief.  Pt complains of an area of redness and irritation to his penis.  Pt reports he was not circumcised.Pt reports area around glands is red and feels blistered.   Past Medical History:  Diagnosis Date  . Cancer [facility])    Prostate, managed in [place]  . Hyperlipidemia    Past Surgical History:  Procedure Laterality Date  . HERNIA REPAIR     Family History  Problem Relation Age of Onset  . Cancer Maternal Grandmother   . Healthy Mother   . Cataracts Mother   . Healthy Maternal Grandfather   . Healthy Paternal Grandmother   . Healthy Paternal Grandfather   . Cancer Sister   . Cancer Brother    Social History  Substance Use Topics  . Smoking status: Former Research scientist (life sciences)  . Smokeless tobacco: Never Used  . Alcohol use 0.0 oz/week     Comment: social use    Review of Systems  Genitourinary: Positive for discharge.  All other systems reviewed and are negative.   Allergies  Patient has no known allergies.  Home Medications   Prior to Admission medications   Medication Sig Start Date End Date Taking? Authorizing Provider  OVER THE COUNTER MEDICATION A and C pill Fish oil Latanoprost eye drops clotrimazole   Yes Historical Provider, MD  SELENIUM PO Take by mouth.   Yes Historical Provider, MD  clotrimazole (LOTRIMIN) 1 % cream Apply to affected area 2 times daily 08/01/16   Fransico Meadow, PA-C   doxycycline (VIBRAMYCIN) 100 MG capsule Take 1 capsule (100 mg total) by mouth 2 (two) times daily. 08/01/16   Fransico Meadow, PA-C  HYDROcodone-acetaminophen (NORCO/VICODIN) 5-325 MG tablet Take 1-2 tablets by mouth every 4 (four) hours as needed. 07/04/16   Fransico Meadow, PA-C  predniSONE (DELTASONE) 20 MG tablet Two daily with food 07/10/16   Robyn Haber, MD   Meds Ordered and Administered this Visit  Medications - No data to display  BP 132/66 (BP Location: Left Arm)   Pulse (!) 56   Temp 97.8 F (36.6 C) (Oral)   Resp 18   SpO2 99%  No data found.   Physical Exam  Constitutional: He appears well-developed and well-nourished.  HENT:  Head: Normocephalic.  Cardiovascular: Normal rate.   Pulmonary/Chest: Effort normal.  Abdominal: Soft.  Genitourinary:  Genitourinary Comments: Erythematous ring around base of glands.  Skin break down.    Neurological: He is alert.  Skin: Skin is warm.  Psychiatric: He has a normal mood and affect.  Nursing note and vitals reviewed.   Urgent Care Course     Procedures (including critical care time)  Labs Review Labs Reviewed - No data to display  Imaging Review No results found.   Visual Acuity Review  Right Eye Distance:   Left Eye Distance:   Bilateral Distance:    Right Eye Near:   Left Eye Near:    Bilateral Near:  I suspect fungal infection,  Maybe developing bacterial infection.   Pt has an appointment to see Urologist tomorrow for his prostate.  Pt given rx for lotrimin and doxycycline.  Pt advised urologist wound be expert and he should show urologist. MDM   1. Blister (nonthermal) of penis, initial encounter    Meds ordered this encounter  Medications  . SELENIUM PO    Sig: Take by mouth.  Marland Kitchen OVER THE COUNTER MEDICATION    Sig: A and C pill Fish oil Latanoprost eye drops clotrimazole  . clotrimazole (LOTRIMIN) 1 % cream    Sig: Apply to affected area 2 times daily    Dispense:  15 g     Refill:  0    Order Specific Question:   Supervising Provider    Answer:   Sherlene Shams [[id number]]  . doxycycline (VIBRAMYCIN) 100 MG capsule    Sig: Take 1 capsule (100 mg total) by mouth 2 (two) times daily.    Dispense:  20 capsule    Refill:  0    Order Specific Question:   Supervising Provider    Answer:   Sherlene Shams [[id number]]  An After Visit Summary was printed and given to the patient.     [place], PA-C 08/01/16 1239

## 2016-08-01 NOTE — Discharge Instructions (Signed)
See the Urologist tomorrow for evaluation

## 2016-08-02 DIAGNOSIS — M47812 Spondylosis without myelopathy or radiculopathy, cervical region: Secondary | ICD-10-CM | POA: Diagnosis not present

## 2016-08-02 DIAGNOSIS — M542 Cervicalgia: Secondary | ICD-10-CM | POA: Diagnosis not present

## 2016-08-02 DIAGNOSIS — M5030 Other cervical disc degeneration, unspecified cervical region: Secondary | ICD-10-CM | POA: Diagnosis not present

## 2016-08-02 DIAGNOSIS — M9901 Segmental and somatic dysfunction of cervical region: Secondary | ICD-10-CM | POA: Diagnosis not present

## 2016-08-03 DIAGNOSIS — M9901 Segmental and somatic dysfunction of cervical region: Secondary | ICD-10-CM | POA: Diagnosis not present

## 2016-08-03 DIAGNOSIS — M542 Cervicalgia: Secondary | ICD-10-CM | POA: Diagnosis not present

## 2016-08-03 DIAGNOSIS — M5030 Other cervical disc degeneration, unspecified cervical region: Secondary | ICD-10-CM | POA: Diagnosis not present

## 2016-08-03 DIAGNOSIS — M47812 Spondylosis without myelopathy or radiculopathy, cervical region: Secondary | ICD-10-CM | POA: Diagnosis not present

## 2016-08-04 DIAGNOSIS — M5030 Other cervical disc degeneration, unspecified cervical region: Secondary | ICD-10-CM | POA: Diagnosis not present

## 2016-08-04 DIAGNOSIS — M542 Cervicalgia: Secondary | ICD-10-CM | POA: Diagnosis not present

## 2016-08-04 DIAGNOSIS — M9901 Segmental and somatic dysfunction of cervical region: Secondary | ICD-10-CM | POA: Diagnosis not present

## 2016-08-04 DIAGNOSIS — M47812 Spondylosis without myelopathy or radiculopathy, cervical region: Secondary | ICD-10-CM | POA: Diagnosis not present

## 2016-08-08 DIAGNOSIS — M47812 Spondylosis without myelopathy or radiculopathy, cervical region: Secondary | ICD-10-CM | POA: Diagnosis not present

## 2016-08-08 DIAGNOSIS — M542 Cervicalgia: Secondary | ICD-10-CM | POA: Diagnosis not present

## 2016-08-08 DIAGNOSIS — M9901 Segmental and somatic dysfunction of cervical region: Secondary | ICD-10-CM | POA: Diagnosis not present

## 2016-08-08 DIAGNOSIS — M5030 Other cervical disc degeneration, unspecified cervical region: Secondary | ICD-10-CM | POA: Diagnosis not present

## 2016-08-09 DIAGNOSIS — M542 Cervicalgia: Secondary | ICD-10-CM | POA: Diagnosis not present

## 2016-08-09 DIAGNOSIS — M47812 Spondylosis without myelopathy or radiculopathy, cervical region: Secondary | ICD-10-CM | POA: Diagnosis not present

## 2016-08-09 DIAGNOSIS — M5030 Other cervical disc degeneration, unspecified cervical region: Secondary | ICD-10-CM | POA: Diagnosis not present

## 2016-08-09 DIAGNOSIS — M9901 Segmental and somatic dysfunction of cervical region: Secondary | ICD-10-CM | POA: Diagnosis not present

## 2016-08-10 DIAGNOSIS — M9901 Segmental and somatic dysfunction of cervical region: Secondary | ICD-10-CM | POA: Diagnosis not present

## 2016-08-10 DIAGNOSIS — M542 Cervicalgia: Secondary | ICD-10-CM | POA: Diagnosis not present

## 2016-08-10 DIAGNOSIS — M47812 Spondylosis without myelopathy or radiculopathy, cervical region: Secondary | ICD-10-CM | POA: Diagnosis not present

## 2016-08-10 DIAGNOSIS — M5030 Other cervical disc degeneration, unspecified cervical region: Secondary | ICD-10-CM | POA: Diagnosis not present

## 2016-08-11 DIAGNOSIS — Z8546 Personal history of malignant neoplasm of prostate: Secondary | ICD-10-CM | POA: Diagnosis not present

## 2016-08-16 DIAGNOSIS — M542 Cervicalgia: Secondary | ICD-10-CM | POA: Diagnosis not present

## 2016-08-16 DIAGNOSIS — M9901 Segmental and somatic dysfunction of cervical region: Secondary | ICD-10-CM | POA: Diagnosis not present

## 2016-08-16 DIAGNOSIS — M5030 Other cervical disc degeneration, unspecified cervical region: Secondary | ICD-10-CM | POA: Diagnosis not present

## 2016-08-16 DIAGNOSIS — M47812 Spondylosis without myelopathy or radiculopathy, cervical region: Secondary | ICD-10-CM | POA: Diagnosis not present

## 2016-08-18 DIAGNOSIS — M5030 Other cervical disc degeneration, unspecified cervical region: Secondary | ICD-10-CM | POA: Diagnosis not present

## 2016-08-18 DIAGNOSIS — M542 Cervicalgia: Secondary | ICD-10-CM | POA: Diagnosis not present

## 2016-08-18 DIAGNOSIS — M9901 Segmental and somatic dysfunction of cervical region: Secondary | ICD-10-CM | POA: Diagnosis not present

## 2016-08-18 DIAGNOSIS — M47812 Spondylosis without myelopathy or radiculopathy, cervical region: Secondary | ICD-10-CM | POA: Diagnosis not present

## 2016-08-23 DIAGNOSIS — M542 Cervicalgia: Secondary | ICD-10-CM | POA: Diagnosis not present

## 2016-08-23 DIAGNOSIS — M47812 Spondylosis without myelopathy or radiculopathy, cervical region: Secondary | ICD-10-CM | POA: Diagnosis not present

## 2016-08-23 DIAGNOSIS — M9901 Segmental and somatic dysfunction of cervical region: Secondary | ICD-10-CM | POA: Diagnosis not present

## 2016-08-23 DIAGNOSIS — M5030 Other cervical disc degeneration, unspecified cervical region: Secondary | ICD-10-CM | POA: Diagnosis not present

## 2016-08-25 DIAGNOSIS — L853 Xerosis cutis: Secondary | ICD-10-CM | POA: Diagnosis not present

## 2016-08-25 DIAGNOSIS — I209 Angina pectoris, unspecified: Secondary | ICD-10-CM | POA: Diagnosis not present

## 2016-08-25 DIAGNOSIS — I8390 Asymptomatic varicose veins of unspecified lower extremity: Secondary | ICD-10-CM | POA: Diagnosis not present

## 2016-08-30 DIAGNOSIS — I209 Angina pectoris, unspecified: Secondary | ICD-10-CM | POA: Diagnosis not present

## 2016-08-30 DIAGNOSIS — D539 Nutritional anemia, unspecified: Secondary | ICD-10-CM | POA: Diagnosis not present

## 2016-08-30 DIAGNOSIS — R748 Abnormal levels of other serum enzymes: Secondary | ICD-10-CM | POA: Diagnosis not present

## 2016-08-30 DIAGNOSIS — R945 Abnormal results of liver function studies: Secondary | ICD-10-CM | POA: Diagnosis not present

## 2016-08-30 DIAGNOSIS — R7989 Other specified abnormal findings of blood chemistry: Secondary | ICD-10-CM | POA: Diagnosis not present

## 2016-08-30 DIAGNOSIS — R17 Unspecified jaundice: Secondary | ICD-10-CM | POA: Diagnosis not present

## 2016-08-31 DIAGNOSIS — I209 Angina pectoris, unspecified: Secondary | ICD-10-CM | POA: Diagnosis not present

## 2016-09-13 DIAGNOSIS — I209 Angina pectoris, unspecified: Secondary | ICD-10-CM | POA: Diagnosis not present

## 2016-09-13 DIAGNOSIS — D72819 Decreased white blood cell count, unspecified: Secondary | ICD-10-CM | POA: Diagnosis not present

## 2016-10-13 DIAGNOSIS — H401132 Primary open-angle glaucoma, bilateral, moderate stage: Secondary | ICD-10-CM | POA: Diagnosis not present

## 2016-11-22 DIAGNOSIS — D239 Other benign neoplasm of skin, unspecified: Secondary | ICD-10-CM | POA: Diagnosis not present

## 2016-11-22 DIAGNOSIS — B351 Tinea unguium: Secondary | ICD-10-CM | POA: Diagnosis not present

## 2016-11-22 DIAGNOSIS — D225 Melanocytic nevi of trunk: Secondary | ICD-10-CM | POA: Diagnosis not present

## 2016-11-22 DIAGNOSIS — L821 Other seborrheic keratosis: Secondary | ICD-10-CM | POA: Diagnosis not present

## 2016-11-29 DIAGNOSIS — D72819 Decreased white blood cell count, unspecified: Secondary | ICD-10-CM | POA: Diagnosis not present

## 2016-11-29 DIAGNOSIS — R0989 Other specified symptoms and signs involving the circulatory and respiratory systems: Secondary | ICD-10-CM | POA: Diagnosis not present

## 2016-11-30 DIAGNOSIS — I351 Nonrheumatic aortic (valve) insufficiency: Secondary | ICD-10-CM | POA: Diagnosis not present

## 2016-11-30 DIAGNOSIS — I340 Nonrheumatic mitral (valve) insufficiency: Secondary | ICD-10-CM | POA: Diagnosis not present

## 2016-11-30 DIAGNOSIS — R0989 Other specified symptoms and signs involving the circulatory and respiratory systems: Secondary | ICD-10-CM | POA: Diagnosis not present

## 2016-12-13 DIAGNOSIS — D485 Neoplasm of uncertain behavior of skin: Secondary | ICD-10-CM | POA: Diagnosis not present

## 2016-12-13 DIAGNOSIS — I340 Nonrheumatic mitral (valve) insufficiency: Secondary | ICD-10-CM | POA: Diagnosis not present

## 2016-12-13 DIAGNOSIS — D72819 Decreased white blood cell count, unspecified: Secondary | ICD-10-CM | POA: Diagnosis not present

## 2016-12-13 DIAGNOSIS — I351 Nonrheumatic aortic (valve) insufficiency: Secondary | ICD-10-CM | POA: Diagnosis not present

## 2016-12-15 ENCOUNTER — Ambulatory Visit: Payer: Self-pay | Admitting: Family Medicine

## 2016-12-19 DIAGNOSIS — L821 Other seborrheic keratosis: Secondary | ICD-10-CM | POA: Diagnosis not present

## 2016-12-20 ENCOUNTER — Ambulatory Visit: Payer: Self-pay | Admitting: Family Medicine

## 2016-12-27 DIAGNOSIS — L821 Other seborrheic keratosis: Secondary | ICD-10-CM | POA: Diagnosis not present

## 2017-01-09 DIAGNOSIS — L821 Other seborrheic keratosis: Secondary | ICD-10-CM | POA: Diagnosis not present

## 2017-01-30 ENCOUNTER — Encounter (HOSPITAL_COMMUNITY): Payer: Self-pay | Admitting: *Deleted

## 2017-01-30 ENCOUNTER — Ambulatory Visit (HOSPITAL_COMMUNITY)
Admission: EM | Admit: 2017-01-30 | Discharge: 2017-01-30 | Disposition: A | Discharge status: Home / Self Care | Payer: Medicare HMO | Attending: Emergency Medicine | Admitting: Emergency Medicine

## 2017-01-30 DIAGNOSIS — L989 Disorder of the skin and subcutaneous tissue, unspecified: Secondary | ICD-10-CM

## 2017-01-30 MED ORDER — MUPIROCIN 2 % EX OINT: 1.0000 "application " | TOPICAL_OINTMENT | Freq: Three times a day (TID) | CUTANEOUS | 0 refills | Status: DC

## 2017-01-30 MED ORDER — NYSTATIN 100000 UNIT/GM EX CREA: TOPICAL_CREAM | CUTANEOUS | 0 refills | Status: DC

## 2017-01-30 NOTE — Discharge Instructions (Signed)
Try wrapping the area with gauze to help prevent chafing. Stop using iodine and alcohol. Soap and water are fine. Make sure you dry the area carefully off. Consider using a hair dryer on low heat.  Below is a list of primary care practices who are taking new patients for you to follow-up with. [facility] [place], [place] [address] [phone]  [facility] [phone] [place] [address]  [facility]: [place]  [phone]  [facility]: [place]   [phone]  [facility]: [address] [place]  [phone]  [place] [facility] : [address] [phone]  [facility]: [address]  [place] [address] [phone]  [facility]: [place] [phone]  Dr. Blanchie Serve [place]  [phone]  Dr. Benito Mccreedy, [facility]. [place], [place] [address]  [phone]  Go to www.goodrx.com to look up your medications. This will give you a list of where you can find your prescriptions at the most affordable prices. Or ask the pharmacist what the cash price is, or if they have any other discount programs available to help make your medication more affordable. This can be less expensive than what you would pay with insurance.

## 2017-01-30 NOTE — ED Provider Notes (Signed)
HPI  SUBJECTIVE:  Troy Salinas is a 83 y.o. male who presents with a "sore" at the tip of his penis underneath the foreskin for the past month. He states that he has recurrent sores in this area which usually resolve with over-the-counter antibiotic ointment. He denies using any creams in this area.. He has been using iodine and alcohol as well. Symptoms are better when he does not exercise, worse when he exercises and his penis rubs against his underclothes.no preceding itching, burning, paresthesias, blisters. No penile discharge, dysuria, urinary complaints. He reports clearish drainage. No penile or scrotal swelling. He states that he wears briefs but has switched the boxers recently. States that he has not been sexually active in "years". He denies , Soaps, detergents, fevers, bodyaches, groin swelling. Past medical history prostate cancer, tinea cruris, remote history of gonorrhea. No history diabetes, yeast, balanitis, hypertension, chlamydia, HSV, syphilis, Trichomonas. PMD: None.  Patient was seen here for this same back in April. He was sent home with clotrimazole and doxycycline.  Past Medical History:  Diagnosis Date  . Cancer [facility])    Prostate, managed in [place]  . Hyperlipidemia     Past Surgical History:  Procedure Laterality Date  . HERNIA REPAIR      Family History  Problem Relation Age of Onset  . Cancer Maternal Grandmother   . Healthy Mother   . Cataracts Mother   . Healthy Maternal Grandfather   . Healthy Paternal Grandmother   . Healthy Paternal Grandfather   . Cancer Sister   . Cancer Brother     Social History  Substance Use Topics  . Smoking status: Former Research scientist (life sciences)  . Smokeless tobacco: Never Used  . Alcohol use 0.0 oz/week     Comment: social use    No current facility-administered medications for this encounter.   Current Outpatient Prescriptions:  .  mupirocin ointment (BACTROBAN) 2 %, Apply 1 application topically 3 (three) times daily., Disp: 22  g, Rfl: 0 .  nystatin cream (MYCOSTATIN), Apply to affected area 2 times daily till symptoms resolve, Disp: 30 g, Rfl: 0 .  OVER THE COUNTER MEDICATION, A and C pill Fish oil Latanoprost eye drops clotrimazole, Disp: , Rfl:  .  SELENIUM PO, Take by mouth., Disp: , Rfl:   No Known Allergies   ROS  As noted in HPI.   Physical Exam  BP 123/81 (BP Location: Left Arm)   Pulse (!) 52   Temp 97.8 F (36.6 C)   Resp 17   SpO2 100%   Constitutional: Well developed, well nourished, no acute distress Eyes:  EOMI, conjunctiva normal bilaterally HENT: Normocephalic, atraumatic,mucus membranes moist Respiratory: Normal inspiratory effort Cardiovascular: Normal rate GI: nondistended  GU: Normal uncircumcised male. Positive symmetric nontender erosion at the glands of the penis where the foreskin attaches. no apparent blisters. See picture.    Lymph: No inguinal lymphadenopathy skin: No rash, skin intact Musculoskeletal: no deformities Neurologic: Alert & oriented x 3, no focal neuro deficits Psychiatric: Speech and behavior appropriate   ED Course   Medications - No data to display  No orders of the defined types were placed in this encounter.   No results found for this or any previous visit (from the past 24 hour(s)). No results found.  ED Clinical Impression  Skin erosion   ED Assessment/Plan  Concern for yeast infection with perhaps a secondary skin infection. Home with topical antifungal and Bactroban. Does not appear to be herpes given duration of the rash.  has not been sexually active "in years" so doubt syphilis.  Providing a primary care referral list for routine care.  Meds ordered this encounter  Medications  . mupirocin ointment (BACTROBAN) 2 %    Sig: Apply 1 application topically 3 (three) times daily.    Dispense:  22 g    Refill:  0  . nystatin cream (MYCOSTATIN)    Sig: Apply to affected area 2 times daily till symptoms resolve    Dispense:  30 g     Refill:  0    *This clinic note was created using Lobbyist. Therefore, there may be occasional mistakes despite careful proofreading.  ?   Melynda Ripple, MD 01/31/17 [phone]

## 2017-01-30 NOTE — ED Triage Notes (Signed)
Patient reports sores to his penis. History of prostate cancer and was given medication to heal penis. Patient states and he is very active. States it will heal and then will flare up again. No new sexual partners. Denies discharge from penis.   Reports he was told he had liver issues at another UC and would like to see how his liver is doing.

## 2017-02-04 DIAGNOSIS — R69 Illness, unspecified: Secondary | ICD-10-CM | POA: Diagnosis not present

## 2017-03-07 ENCOUNTER — Other Ambulatory Visit: Payer: Self-pay

## 2017-03-07 ENCOUNTER — Ambulatory Visit (HOSPITAL_COMMUNITY)
Admission: EM | Admit: 2017-03-07 | Discharge: 2017-03-07 | Disposition: A | Discharge status: Home / Self Care | Payer: Medicare HMO | Attending: Family Medicine | Admitting: Family Medicine

## 2017-03-07 ENCOUNTER — Encounter (HOSPITAL_COMMUNITY): Payer: Self-pay | Admitting: Family Medicine

## 2017-03-07 ENCOUNTER — Ambulatory Visit (INDEPENDENT_AMBULATORY_CARE_PROVIDER_SITE_OTHER): Payer: Medicare HMO

## 2017-03-07 DIAGNOSIS — M1611 Unilateral primary osteoarthritis, right hip: Secondary | ICD-10-CM | POA: Diagnosis not present

## 2017-03-07 DIAGNOSIS — M25551 Pain in right hip: Secondary | ICD-10-CM | POA: Diagnosis not present

## 2017-03-07 DIAGNOSIS — M25559 Pain in unspecified hip: Secondary | ICD-10-CM

## 2017-03-07 MED ORDER — MELOXICAM 7.5 MG PO TABS: 7.5000 mg | ORAL_TABLET | Freq: Every day | ORAL | 1 refills | Status: DC

## 2017-03-07 NOTE — ED Notes (Signed)
Triage by Katrinka Blazing, rma

## 2017-03-07 NOTE — ED Provider Notes (Signed)
[place]   [id number] 03/07/17 Arrival Time: 8416   SUBJECTIVE:  Troy Salinas is a 83 y.o. male who presents to the urgent care with complaint of Hip pain for about 1 month, right hip pain on right side, when he first wake up in the morning it starts to hurt after he starts going it gets better. He plays tennis  The pain is mild and usually when he rotates in the morning to get out of bed.  H/o prostate cancer  Past Medical History:  Diagnosis Date  . Cancer [facility])    Prostate, managed in [place]  . Hyperlipidemia    Family History  Problem Relation Age of Onset  . Cancer Maternal Grandmother   . Healthy Mother   . Cataracts Mother   . Healthy Maternal Grandfather   . Healthy Paternal Grandmother   . Healthy Paternal Grandfather   . Cancer Sister   . Cancer Brother    Social History   Socioeconomic History  . Marital status: Divorced    Spouse name: Not on file  . Number of children: 1  . Years of education: 61  . Highest education level: Not on file  Social Needs  . Financial resource strain: Not on file  . Food insecurity - worry: Not on file  . Food insecurity - inability: Not on file  . Transportation needs - medical: Not on file  . Transportation needs - non-medical: Not on file  Occupational History  . Occupation: Retired  Tobacco Use  . Smoking status: Former Research scientist (life sciences)  . Smokeless tobacco: Never Used  Substance and Sexual Activity  . Alcohol use: Yes    Alcohol/week: 0.0 oz    Comment: social use  . Drug use: No  . Sexual activity: Not on file  Other Topics Concern  . Not on file  Social History Narrative   Fun: Plays tennis   Feels safe at home and denies abuse   Current Meds  Medication Sig  . OVER THE COUNTER MEDICATION A and C pill Fish oil Latanoprost eye drops clotrimazole  . SELENIUM PO Take by mouth.   No Known Allergies    ROS: As per HPI, remainder of ROS negative.   OBJECTIVE:   Vitals:   03/07/17 1111  BP:  136/65  Pulse: 67  Resp: 16  Temp: 97.8 F (36.6 C)  TempSrc: Oral  SpO2: 100%     General appearance: alert; no distress Eyes: PERRL; EOMI; conjunctiva normal HENT: normocephalic; atraumatic;  external ears normal without trauma; nasal mucosa normal; oral mucosa normal Neck: supple Back: no CVA tenderness Extremities: no cyanosis or edema; symmetrical with no gross deformities;  nontender right hip with full painless ROM Skin: warm and dry Neurologic: normal gait; grossly normal Psychological: alert and cooperative; normal mood and affect   Right hip film:  Exophytic changes on right greater trochanter   Labs:  Results for orders placed or performed during the hospital encounter of 10/09/14  Exercise Tolerance Test  Result Value Ref Range   Rest HR 69 bpm   Rest BP 116/79 mmHg   Exercise duration (min) 10 min   Exercise duration (sec) 0 sec   Estimated workload 11.7 METS   Peak HR 151 bpm   Peak BP 186/73 mmHg   MPHR 140 bpm   Percent HR 107 %   RPE 15     Labs Reviewed - No data to display  Dg Hip Unilat With Pelvis 2-3 Views Right  Result Date:  03/07/2017 CLINICAL DATA:  Right hip pain for 1 month without known injury. EXAM: DG HIP (WITH OR WITHOUT PELVIS) 2-3V RIGHT COMPARISON:  None. FINDINGS: There is no evidence of hip fracture or dislocation. There is no evidence of arthropathy or other focal bone abnormality. IMPRESSION: Normal right hip. Electronically Signed   By: Marijo Conception, M.D.   On: 03/07/2017 11:40       ASSESSMENT & PLAN:  1. Arthritis of right hip   2. Hip pain     Meds ordered this encounter  Medications  . meloxicam (MOBIC) 7.5 MG tablet    Sig: Take 1 tablet (7.5 mg total) by mouth at bedtime.    Dispense:  30 tablet    Refill:  1    Reviewed expectations re: course of current medical issues. Questions answered. Outlined signs and symptoms indicating need for more acute intervention. Patient verbalized understanding. After  Visit Summary given.    Procedures:      Robyn Haber, MD 03/07/17 1143

## 2017-03-07 NOTE — ED Triage Notes (Signed)
Hip pain for about 1 month, hip pain on right side, when he first wake up in the morning it starts to hurt, per pt after he starts going it gets better, per pt he plays tennis

## 2017-03-07 NOTE — Discharge Instructions (Signed)
If you are not better in 10 days, call [facility] for an appointment.

## 2017-05-05 ENCOUNTER — Ambulatory Visit (HOSPITAL_COMMUNITY)
Admission: EM | Admit: 2017-05-05 | Discharge: 2017-05-05 | Disposition: A | Discharge status: Home / Self Care | Payer: Medicare HMO | Attending: Internal Medicine | Admitting: Internal Medicine

## 2017-05-05 ENCOUNTER — Other Ambulatory Visit: Payer: Self-pay

## 2017-05-05 ENCOUNTER — Encounter (HOSPITAL_COMMUNITY): Payer: Self-pay | Admitting: Emergency Medicine

## 2017-05-05 DIAGNOSIS — H6123 Impacted cerumen, bilateral: Secondary | ICD-10-CM | POA: Diagnosis not present

## 2017-05-05 DIAGNOSIS — H9313 Tinnitus, bilateral: Secondary | ICD-10-CM | POA: Diagnosis not present

## 2017-05-05 MED ORDER — NEOMYCIN-POLYMYXIN-HC 3.5-10000-1 OT SUSP: 3.0000 [drp] | Freq: Four times a day (QID) | OTIC | 0 refills | Status: AC

## 2017-05-05 NOTE — ED Provider Notes (Signed)
[place]    CSN: [id number] Arrival date & time: 05/05/17  1140     History   Chief Complaint Chief Complaint  Patient presents with  . Palpitations    HPI Troy Salinas is a 84 y.o. male.   He presents today with ringing in his ears for the last few months.  Been doing some reading about this, and became concerned that it actually represented something more strokelike, and is here for evaluation.  He is not having any difficulty with walking, or moving his extremities.  He walked in independently and was able to climb on/off the exam table easily without assistance.  Face is symmetric, and speech is clear and coherent.  He has a tendency to have earwax, and wonders if this might be contributing as well.  He is somewhat hard of hearing, and states that the ringing is worse in quiet environments like when he lies down to go to sleep at night.  No ear ache, no drainage from ears.  No runny/congested nose, not coughing.  Has removed some wax from the right ear, with OTC materials.   HPI  Past Medical History:  Diagnosis Date  . Cancer [facility])    Prostate, managed in [place]  . Hyperlipidemia     Patient Active Problem List   Diagnosis Date Noted  . Skin macule 11/26/2014  . Cataracts, bilateral 11/26/2014  . Screening for lipid disorders 09/05/2014  . Shortness of breath 09/05/2014  . Junctional rhythm 09/05/2014  . Lightheadedness 09/05/2014  . ADENOCARCINOMA, PROSTATE 10/23/2009  . HYPERTROPHY PROSTATE W/O UR OBST & OTH LUTS 05/25/2009    Past Surgical History:  Procedure Laterality Date  . HERNIA REPAIR     had 2       Home Medications    Prior to Admission medications   Medication Sig Start Date End Date Taking? Authorizing Provider  meloxicam (MOBIC) 7.5 MG tablet Take 1 tablet (7.5 mg total) by mouth at bedtime. 03/07/17   Robyn Haber, MD  mupirocin ointment (BACTROBAN) 2 % Apply 1 application topically 3 (three) times daily. 01/30/17    Melynda Ripple, MD  neomycin-polymyxin-hydrocortisone (CORTISPORIN) 3.5-10000-1 OTIC suspension Place 3 drops into both ears 4 (four) times daily for 5 days. 05/05/17 05/10/17  Wynona Luna, MD  nystatin cream (MYCOSTATIN) Apply to affected area 2 times daily till symptoms resolve 01/30/17   Melynda Ripple, MD  OVER THE COUNTER MEDICATION A and C pill Fish oil Latanoprost eye drops clotrimazole    [provider]  SELENIUM PO Take by mouth.    [provider]    Family History Family History  Problem Relation Age of Onset  . Cancer Maternal Grandmother   . Healthy Mother   . Cataracts Mother   . Healthy Maternal Grandfather   . Healthy Paternal Grandmother   . Healthy Paternal Grandfather   . Cancer Sister   . Cancer Brother     Social History Social History   Tobacco Use  . Smoking status: Former Research scientist (life sciences)  . Smokeless tobacco: Never Used  Substance Use Topics  . Alcohol use: Yes    Alcohol/week: 0.0 oz    Comment: social use  . Drug use: No     Allergies   Patient has no known allergies.   Review of Systems Review of Systems  All other systems reviewed and are negative.    Physical Exam Triage Vital Signs ED Triage Vitals [05/05/17 1311]  Enc Vitals Group  BP 122/77     Pulse Rate (!) 56     Resp      Temp 98.2 F (36.8 C)     Temp Source Oral     SpO2 100 %     Weight      Height      Pain Score      Pain Loc    Updated Vital Signs BP 122/77 (BP Location: Right Arm)   Pulse (!) 56   Temp 98.2 F (36.8 C) (Oral)   SpO2 100%  Physical Exam  Constitutional: He is oriented to person, place, and time. No distress.  Alert, nicely groomed  HENT:  Head: Atraumatic.  Bilateral near total earwax impactions.  No pain with manipulation of the outer ears.  Eyes:  Conjugate gaze, no eye redness/drainage  Neck: Neck supple.  Cardiovascular: Normal rate.  Pulmonary/Chest: No respiratory distress.  Abdominal: He exhibits  no distension.  Musculoskeletal: Normal range of motion.  Neurological: He is alert and oriented to person, place, and time.  As noted in the HPI, patient was able to walk without assistance, easily into the urgent care and climb on/off the exam table.  He is seen to move all extremities easily.  Face is symmetric and speech is clear and coherent  Skin: Skin is warm and dry.  No cyanosis  Nursing note and vitals reviewed.    UC Treatments / Results   Procedures Procedures (including critical care time) Ears irrigated free of wax per clinical staff  Final Clinical Impressions(s) / UC Diagnoses   Final diagnoses:  Tinnitus aurium, bilateral  Bilateral impacted cerumen   Anticipate gradual improvement in ringing in the ears over the next day or 2.  Prescription for eardrops, to soothe the ear canals, was sent to the pharmacy.  If ringing in the ears persists, or is increasing, would follow-up with an ear/nose/throat doctor for further evaluation.  Persistent ringing in the ears can be a sign of hearing loss, or of ear wax buildup.  I did not see signs of a stroke today on exam.  ED Discharge Orders        Ordered    neomycin-polymyxin-hydrocortisone (CORTISPORIN) 3.5-10000-1 OTIC suspension  4 times daily     05/05/17 1414         Wynona Luna, MD 05/06/17 1148

## 2017-05-05 NOTE — ED Triage Notes (Addendum)
Pt states that he can hear his heartbeat in his ears.  When there is noise around he states he doesn't really hear it, but it is very prominent at night.  Pt denies any SOB or CP at these times.  He does report CP/tightness when he gets on his treadmill, but he states it is because he is out of shape.

## 2017-05-05 NOTE — Discharge Instructions (Addendum)
Anticipate gradual improvement in ringing in the ears over the next day or 2.  Prescription for eardrops, to soothe the ear canals, was sent to the pharmacy.  If ringing in the ears persists, or is increasing, would follow-up with an ear/nose/throat doctor for further evaluation.  Persistent ringing in the ears can be a sign of hearing loss, or of ear wax buildup.  I did not see signs of a stroke today on exam.

## 2017-05-06 DIAGNOSIS — H6691 Otitis media, unspecified, right ear: Secondary | ICD-10-CM | POA: Diagnosis not present

## 2017-05-15 DIAGNOSIS — H6121 Impacted cerumen, right ear: Secondary | ICD-10-CM | POA: Diagnosis not present

## 2017-05-18 DIAGNOSIS — H401132 Primary open-angle glaucoma, bilateral, moderate stage: Secondary | ICD-10-CM | POA: Diagnosis not present

## 2017-06-01 DIAGNOSIS — H401132 Primary open-angle glaucoma, bilateral, moderate stage: Secondary | ICD-10-CM | POA: Diagnosis not present

## 2017-06-23 DIAGNOSIS — M7989 Other specified soft tissue disorders: Secondary | ICD-10-CM | POA: Diagnosis not present

## 2017-06-24 DIAGNOSIS — Z1339 Encounter for screening examination for other mental health and behavioral disorders: Secondary | ICD-10-CM | POA: Diagnosis not present

## 2017-06-24 DIAGNOSIS — E7800 Pure hypercholesterolemia, unspecified: Secondary | ICD-10-CM | POA: Diagnosis not present

## 2017-06-24 DIAGNOSIS — Z8546 Personal history of malignant neoplasm of prostate: Secondary | ICD-10-CM | POA: Diagnosis not present

## 2017-06-24 DIAGNOSIS — Z1331 Encounter for screening for depression: Secondary | ICD-10-CM | POA: Diagnosis not present

## 2017-06-24 DIAGNOSIS — Z79899 Other long term (current) drug therapy: Secondary | ICD-10-CM | POA: Diagnosis not present

## 2017-06-24 DIAGNOSIS — Z0000 Encounter for general adult medical examination without abnormal findings: Secondary | ICD-10-CM | POA: Diagnosis not present

## 2017-06-24 DIAGNOSIS — Z125 Encounter for screening for malignant neoplasm of prostate: Secondary | ICD-10-CM | POA: Diagnosis not present

## 2017-06-24 DIAGNOSIS — Z131 Encounter for screening for diabetes mellitus: Secondary | ICD-10-CM | POA: Diagnosis not present

## 2017-06-26 DIAGNOSIS — R69 Illness, unspecified: Secondary | ICD-10-CM | POA: Diagnosis not present

## 2017-07-21 ENCOUNTER — Ambulatory Visit (HOSPITAL_COMMUNITY)
Admission: EM | Admit: 2017-07-21 | Discharge: 2017-07-21 | Disposition: A | Discharge status: Home / Self Care | Payer: Medicare HMO | Attending: Internal Medicine | Admitting: Internal Medicine

## 2017-07-21 ENCOUNTER — Encounter (HOSPITAL_COMMUNITY): Payer: Self-pay | Admitting: Family Medicine

## 2017-07-21 ENCOUNTER — Ambulatory Visit (HOSPITAL_COMMUNITY): Admission: EM | Admit: 2017-07-21 | Discharge: 2017-07-21 | Discharge status: Absent without leave | Payer: Medicare HMO

## 2017-07-21 DIAGNOSIS — D229 Melanocytic nevi, unspecified: Secondary | ICD-10-CM

## 2017-07-21 DIAGNOSIS — L708 Other acne: Secondary | ICD-10-CM

## 2017-07-21 MED ORDER — BENZOYL PEROXIDE 5 % EX LIQD: Freq: Two times a day (BID) | CUTANEOUS | 0 refills | Status: DC

## 2017-07-21 NOTE — ED Provider Notes (Signed)
[place]    CSN: [id number] Arrival date & time: 07/21/17  1255     History   Chief Complaint Chief Complaint  Patient presents with  . Nevus    HPI Troy Salinas is a 84 y.o. male presenting today with concern for moles and bumps on his head.  He has had some small bumps located near the left side posterior lower scalp that have come and gone over the past 6 months.  He also notes that he wears a hat while playing tennis frequently and has noticed them around the area he wears a hat.  He denies any itching or irritation or pain related to them.  Is also concerned about darker areas located on the more superior aspect of the scalp.  He states that these have been there for a while as well but he is unsure of how long if they have changed.  HPI  Past Medical History:  Diagnosis Date  . Cancer [facility])    Prostate, managed in [place]  . Hyperlipidemia     Patient Active Problem List   Diagnosis Date Noted  . Skin macule 11/26/2014  . Cataracts, bilateral 11/26/2014  . Screening for lipid disorders 09/05/2014  . Shortness of breath 09/05/2014  . Junctional rhythm 09/05/2014  . Lightheadedness 09/05/2014  . ADENOCARCINOMA, PROSTATE 10/23/2009  . HYPERTROPHY PROSTATE W/O UR OBST & OTH LUTS 05/25/2009    Past Surgical History:  Procedure Laterality Date  . HERNIA REPAIR     had 2       Home Medications    Prior to Admission medications   Medication Sig Start Date End Date Taking? Authorizing Provider  benzoyl peroxide 5 % external liquid Apply topically 2 (two) times daily. 07/21/17   Keidy Thurgood C, PA-C  meloxicam (MOBIC) 7.5 MG tablet Take 1 tablet (7.5 mg total) by mouth at bedtime. 03/07/17   Robyn Haber, MD  mupirocin ointment (BACTROBAN) 2 % Apply 1 application topically 3 (three) times daily. 01/30/17   Melynda Ripple, MD  nystatin cream (MYCOSTATIN) Apply to affected area 2 times daily till symptoms resolve 01/30/17   Melynda Ripple, MD    OVER THE COUNTER MEDICATION A and C pill Fish oil Latanoprost eye drops clotrimazole    [provider]  SELENIUM PO Take by mouth.    [provider]    Family History Family History  Problem Relation Age of Onset  . Cancer Maternal Grandmother   . Healthy Mother   . Cataracts Mother   . Healthy Maternal Grandfather   . Healthy Paternal Grandmother   . Healthy Paternal Grandfather   . Cancer Sister   . Cancer Brother     Social History Social History   Tobacco Use  . Smoking status: Former Research scientist (life sciences)  . Smokeless tobacco: Never Used  Substance Use Topics  . Alcohol use: Yes    Alcohol/week: 0.0 oz    Comment: social use  . Drug use: No     Allergies   Patient has no known allergies.   Review of Systems Review of Systems  Constitutional: Negative for fatigue and fever.  Respiratory: Negative for shortness of breath.   Cardiovascular: Negative for chest pain.  Gastrointestinal: Negative for nausea and vomiting.  Musculoskeletal: Negative for myalgias and neck pain.  Skin: Positive for color change and rash.  Neurological: Positive for dizziness. Negative for weakness, light-headedness and headaches.     Physical Exam Triage Vital Signs ED Triage Vitals  Enc Vitals Group  BP 07/21/17 1404 120/68     Pulse Rate 07/21/17 1404 63     Resp 07/21/17 1404 18     Temp 07/21/17 1404 98.4 F (36.9 C)     Temp src --      SpO2 07/21/17 1404 99 %     Weight --      Height --      Head Circumference --      Peak Flow --      Pain Score 07/21/17 1403 0     Pain Loc --      Pain Edu? --      Excl. in [place]? --    No data found.  Updated Vital Signs BP 120/68   Pulse 63   Temp 98.4 F (36.9 C)   Resp 18   SpO2 99%   Visual Acuity Right Eye Distance:   Left Eye Distance:   Bilateral Distance:    Right Eye Near:   Left Eye Near:    Bilateral Near:     Physical Exam  Constitutional: He appears well-developed and well-nourished.   HENT:  Head: Normocephalic and atraumatic.  Eyes: Conjunctivae are normal.  Neck: Neck supple.  Cardiovascular: Normal rate.  Pulmonary/Chest: Effort normal. No respiratory distress.  Musculoskeletal: He exhibits no edema.  Neurological: He is alert.  Skin: Skin is warm and dry.  Multiple small skin colored bumps to left lower and posterior scalp lesions are firm.  Also 0.5 cm nevus-like lesion to superior aspect of scalp with underlying hypopigmentation.  One other nevus-like lesion to right lateral aspect of scalp approximately 1 cm.  See pictures below.  Psychiatric: He has a normal mood and affect.  Nursing note and vitals reviewed.          UC Treatments / Results  Labs (all labs ordered are listed, but only abnormal results are displayed) Labs Reviewed - No data to display  EKG None Radiology No results found.  Procedures Procedures (including critical care time)  Medications Ordered in UC Medications - No data to display   Initial Impression / Assessment and Plan / UC Course  I have reviewed the triage vital signs and the nursing notes.  Pertinent labs & imaging results that were available during my care of the patient were reviewed by me and considered in my medical decision making (see chart for details).     Recommending patient to follow-up with dermatology for further evaluation of lesion on superior aspect of scalp. Lesions on posterior scalp appear inflammatory. Will try benzoyl peroxide on acne-like lesions to lower scalp. Discussed strict return precautions. Patient verbalized understanding and is agreeable with plan.   Final Clinical Impressions(s) / UC Diagnoses   Final diagnoses:  Atypical nevus  Acne-like skin bumps    ED Discharge Orders        Ordered    benzoyl peroxide 5 % external liquid  2 times daily     07/21/17 1444       Controlled Substance Prescriptions [place] Controlled Substance Registry consulted? Not Applicable    Janith Lima, [place] 07/21/17 1452

## 2017-07-21 NOTE — ED Triage Notes (Signed)
Pt here for bumps and moles to head. He noticed the bumps after wearing a swim cap. He states that he doesn't have very god hair hygiene. No redness or drainage.

## 2017-07-21 NOTE — Discharge Instructions (Addendum)
Please use benzoyl peroxide twice daily on head  Please follow up with dermatology about lesion on head.

## 2017-07-24 DIAGNOSIS — L821 Other seborrheic keratosis: Secondary | ICD-10-CM | POA: Diagnosis not present

## 2017-07-24 DIAGNOSIS — L720 Epidermal cyst: Secondary | ICD-10-CM | POA: Diagnosis not present

## 2017-07-24 DIAGNOSIS — L649 Androgenic alopecia, unspecified: Secondary | ICD-10-CM | POA: Diagnosis not present

## 2017-08-14 DIAGNOSIS — Z8546 Personal history of malignant neoplasm of prostate: Secondary | ICD-10-CM | POA: Diagnosis not present

## 2017-09-25 DIAGNOSIS — W57XXXA Bitten or stung by nonvenomous insect and other nonvenomous arthropods, initial encounter: Secondary | ICD-10-CM | POA: Diagnosis not present

## 2017-09-25 DIAGNOSIS — L989 Disorder of the skin and subcutaneous tissue, unspecified: Secondary | ICD-10-CM | POA: Diagnosis not present

## 2017-09-27 DIAGNOSIS — L728 Other follicular cysts of the skin and subcutaneous tissue: Secondary | ICD-10-CM | POA: Diagnosis not present

## 2017-10-02 DIAGNOSIS — H401133 Primary open-angle glaucoma, bilateral, severe stage: Secondary | ICD-10-CM | POA: Diagnosis not present

## 2017-10-02 DIAGNOSIS — H2513 Age-related nuclear cataract, bilateral: Secondary | ICD-10-CM | POA: Diagnosis not present

## 2017-12-08 ENCOUNTER — Ambulatory Visit (HOSPITAL_COMMUNITY)
Admission: EM | Admit: 2017-12-08 | Discharge: 2017-12-08 | Disposition: A | Discharge status: Home / Self Care | Payer: Medicare HMO | Attending: Family Medicine | Admitting: Family Medicine

## 2017-12-08 ENCOUNTER — Encounter (HOSPITAL_COMMUNITY): Payer: Self-pay | Admitting: Emergency Medicine

## 2017-12-08 DIAGNOSIS — M79602 Pain in left arm: Secondary | ICD-10-CM | POA: Diagnosis not present

## 2017-12-08 DIAGNOSIS — H409 Unspecified glaucoma: Secondary | ICD-10-CM

## 2017-12-08 NOTE — ED Triage Notes (Signed)
Pt states a few days ago he felt a pain in the skin of his neck a few days ago, pt also states the skin of his L arm was hurting as well. Pt denies pain at this time. Pt states it feels like its on the surface of his skin and not deep.

## 2017-12-08 NOTE — Discharge Instructions (Addendum)
Your heart evaluation today is normal.  Blood pressure is good, EKG shows no evidence of heart disease or strain. If you continued to have chest pressure with physical exertion (like mowing the lawn) you should see your primary care doctor to talk about getting an exercise stress test I believe the arm pain, and collarbone pain are nothing to worry about

## 2017-12-08 NOTE — ED Provider Notes (Signed)
[place]    CSN: [id number] Arrival date & time: 12/08/17  1019     History   Chief Complaint Chief Complaint  Patient presents with  . Arm Pain    HPI Troy Salinas is a 84 y.o. male.   HPI  Patient is here for left arm pain.  He states while he was resting at home he felt pain in his left upper arm, points to the inner bicep region, that was brief.  He states that it would come and go over.  Of hours.  He states it felt like a "tingling" sensation.  No ache or deep pain.  No overuse or injury of the arm.  He is a vigorous 84 year old who plays tennis 3 days a week.  Today he noticed he has a similar "tingling" pain that comes and goes in his left clavicle region.  It is also fleeting, lasts only for a few seconds.  He is uncertain the cause of these pains.  There was adequate rest.  He has no pain with activity but states that sometimes when he mows his lawn he will feel a pressure in the center of his chest.  He does not have hypertension, high cholesterol, or family history of heart disease.  He does exercise regularly without discomfort.  He states he never feels chest pain when he plays tennis.  I asked patient is reason for coming to the doctor today, and he stated "to check my heart".  Past Medical History:  Diagnosis Date  . Cancer [facility])    Prostate, managed in [place]  . Hyperlipidemia     Patient Active Problem List   Diagnosis Date Noted  . Glaucoma 12/08/2017  . Skin macule 11/26/2014  . Cataracts, bilateral 11/26/2014  . Junctional rhythm 09/05/2014  . ADENOCARCINOMA, PROSTATE 10/23/2009  . HYPERTROPHY PROSTATE W/O UR OBST & OTH LUTS 05/25/2009    Past Surgical History:  Procedure Laterality Date  . HERNIA REPAIR     had 2       Home Medications    Prior to Admission medications   Medication Sig Start Date End Date Taking? Authorizing Provider  OVER THE COUNTER MEDICATION A and C pill Fish oil Latanoprost eye drops clotrimazole     [provider]    Family History Family History  Problem Relation Age of Onset  . Cancer Maternal Grandmother   . Healthy Mother   . Cataracts Mother   . Healthy Maternal Grandfather   . Healthy Paternal Grandmother   . Healthy Paternal Grandfather   . Cancer Sister   . Cancer Brother     Social History Social History   Tobacco Use  . Smoking status: Former Research scientist (life sciences)  . Smokeless tobacco: Never Used  Substance Use Topics  . Alcohol use: Yes    Alcohol/week: 0.0 standard drinks    Comment: social use  . Drug use: No     Allergies   Patient has no known allergies.   Review of Systems Review of Systems  Constitutional: Negative for chills and fever.  HENT: Negative for ear pain and sore throat.   Eyes: Negative for pain and visual disturbance.  Respiratory: Positive for chest tightness. Negative for cough and shortness of breath.        One episode, when mowing the lawn  Cardiovascular: Negative for chest pain and palpitations.  Gastrointestinal: Negative for abdominal pain and vomiting.  Genitourinary: Negative for dysuria and hematuria.  Musculoskeletal: Negative for arthralgias and back pain.  Skin: Negative for color change and rash.  Neurological: Negative for seizures and syncope.  All other systems reviewed and are negative.    Physical Exam Triage Vital Signs ED Triage Vitals  Enc Vitals Group     BP 12/08/17 1028 121/68     Pulse Rate 12/08/17 1028 64     Resp 12/08/17 1028 18     Temp 12/08/17 1028 97.6 F (36.4 C)     Temp src --      SpO2 12/08/17 1028 100 %   No data found.  Updated Vital Signs BP 121/68   Pulse 64   Temp 97.6 F (36.4 C)   Resp 18   SpO2 100%   Visual Acuity Right Eye Distance:   Left Eye Distance:   Bilateral Distance:    Right Eye Near:   Left Eye Near:    Bilateral Near:     Physical Exam  Constitutional: He appears well-developed and well-nourished. No distress.  HENT:  Head: Normocephalic and  atraumatic.  Mouth/Throat: Oropharynx is clear and moist.  Eyes: Pupils are equal, round, and reactive to light. Conjunctivae are normal.  Neck: Normal range of motion.  Cardiovascular: Normal rate, regular rhythm and normal heart sounds.  Bradycardia  Pulmonary/Chest: Effort normal and breath sounds normal. No respiratory distress. He has no rales.  Abdominal: Soft. Bowel sounds are normal. He exhibits no distension.  No organomegaly  Musculoskeletal: Normal range of motion. He exhibits no edema.  Neurological: He is alert.  Skin: Skin is warm and dry.  Skin of both upper arms, as well as the neck and clavicle is examined carefully.  No rash or abnormality noted  Psychiatric: He has a normal mood and affect. His behavior is normal.     UC Treatments / Results  Labs (all labs ordered are listed, but only abnormal results are displayed) Labs Reviewed - No data to display  EKG None  Radiology No results found.  Procedures Procedures (including critical care time)  Medications Ordered in UC Medications - No data to display  Initial Impression / Assessment and Plan / UC Course  I have reviewed the triage vital signs and the nursing notes.  Pertinent labs & imaging results that were available during my care of the patient were reviewed by me and considered in my medical decision making (see chart for details).     Discussed that he has a benign examination.  He does not have any physical exam findings or historical findings to worry about coronary artery disease.  The only thing he told me worrisome, was that he had some chest pressure he mows his lawn.  Final diagnoses:  Left arm pain     Discharge Instructions     Your heart evaluation today is normal.  Blood pressure is good, EKG shows no evidence of heart disease or strain. If you continued to have chest pressure with physical exertion (like mowing the lawn) you should see your primary care doctor to talk about  getting an exercise stress test I believe the arm pain, and collarbone pain are nothing to worry about   ED Prescriptions    None     Controlled Substance Prescriptions [place] Controlled Substance Registry consulted? Not Applicable  Raylene Everts, MD 12/08/17 1218

## 2018-01-05 ENCOUNTER — Ambulatory Visit (INDEPENDENT_AMBULATORY_CARE_PROVIDER_SITE_OTHER): Payer: Medicare HMO

## 2018-01-05 ENCOUNTER — Encounter (HOSPITAL_COMMUNITY): Payer: Self-pay | Admitting: Emergency Medicine

## 2018-01-05 ENCOUNTER — Ambulatory Visit (HOSPITAL_COMMUNITY)
Admission: EM | Admit: 2018-01-05 | Discharge: 2018-01-05 | Disposition: A | Discharge status: Home / Self Care | Payer: Medicare HMO | Attending: Internal Medicine | Admitting: Internal Medicine

## 2018-01-05 DIAGNOSIS — S60011A Contusion of right thumb without damage to nail, initial encounter: Secondary | ICD-10-CM | POA: Diagnosis not present

## 2018-01-05 DIAGNOSIS — S6991XA Unspecified injury of right wrist, hand and finger(s), initial encounter: Secondary | ICD-10-CM

## 2018-01-05 DIAGNOSIS — S61011A Laceration without foreign body of right thumb without damage to nail, initial encounter: Secondary | ICD-10-CM

## 2018-01-05 DIAGNOSIS — M79644 Pain in right finger(s): Secondary | ICD-10-CM | POA: Diagnosis not present

## 2018-01-05 NOTE — ED Provider Notes (Signed)
[place]    CSN: [id number] Arrival date & time: 01/05/18  1115     History   Chief Complaint Chief Complaint  Patient presents with  . Thumb Injury    HPI Troy Salinas is a 84 y.o. male history of prostate cancer, hyperlipidemia presenting today for evaluation of right thumb injury.  Patient accidentally got his thumb caught in the car door yesterday evening.  He had a wound to his thumb states that this was bleeding significantly afterward, he was able to control it with pressure, has been cleaning it with alcohol as well as Betadine.  States his tetanus was updated approximately 1 year ago.  Since he has developed significant swelling to his thumb.  He states that he is still able to move his thumb, but is mainly limited by the swelling.  Denies numbness or tingling.  HPI  Past Medical History:  Diagnosis Date  . Cancer [facility])    Prostate, managed in [place]  . Hyperlipidemia     Patient Active Problem List   Diagnosis Date Noted  . Glaucoma 12/08/2017  . Skin macule 11/26/2014  . Cataracts, bilateral 11/26/2014  . Junctional rhythm 09/05/2014  . ADENOCARCINOMA, PROSTATE 10/23/2009  . HYPERTROPHY PROSTATE W/O UR OBST & OTH LUTS 05/25/2009    Past Surgical History:  Procedure Laterality Date  . HERNIA REPAIR     had 2       Home Medications    Prior to Admission medications   Medication Sig Start Date End Date Taking? Authorizing Provider  OVER THE COUNTER MEDICATION A and C pill Fish oil Latanoprost eye drops clotrimazole    [provider]    Family History Family History  Problem Relation Age of Onset  . Cancer Maternal Grandmother   . Healthy Mother   . Cataracts Mother   . Healthy Maternal Grandfather   . Healthy Paternal Grandmother   . Healthy Paternal Grandfather   . Cancer Sister   . Cancer Brother     Social History Social History   Tobacco Use  . Smoking status: Former Research scientist (life sciences)  . Smokeless tobacco: Never Used    Substance Use Topics  . Alcohol use: Yes    Alcohol/week: 0.0 standard drinks    Comment: social use  . Drug use: No     Allergies   Patient has no known allergies.   Review of Systems Review of Systems  Constitutional: Negative for fatigue and fever.  Eyes: Negative for redness, itching and visual disturbance.  Respiratory: Negative for shortness of breath.   Cardiovascular: Negative for chest pain and leg swelling.  Gastrointestinal: Negative for nausea and vomiting.  Musculoskeletal: Positive for arthralgias and joint swelling. Negative for myalgias.  Skin: Positive for color change and wound. Negative for rash.  Neurological: Negative for dizziness, syncope, weakness, light-headedness and headaches.     Physical Exam Triage Vital Signs ED Triage Vitals [01/05/18 1136]  Enc Vitals Group     BP 124/75     Pulse Rate 66     Resp 16     Temp 97.7 F (36.5 C)     Temp src      SpO2 100 %     Weight      Height      Head Circumference      Peak Flow      Pain Score      Pain Loc      Pain Edu?      Excl. in  [place]?    No data found.  Updated Vital Signs BP 124/75   Pulse 66   Temp 97.7 F (36.5 C)   Resp 16   SpO2 100%   Visual Acuity Right Eye Distance:   Left Eye Distance:   Bilateral Distance:    Right Eye Near:   Left Eye Near:    Bilateral Near:     Physical Exam  Constitutional: He is oriented to person, place, and time. He appears well-developed and well-nourished.  No acute distress  HENT:  Head: Normocephalic and atraumatic.  Nose: Nose normal.  Eyes: Conjunctivae are normal.  Neck: Neck supple.  Cardiovascular: Normal rate.  Pulmonary/Chest: Effort normal. No respiratory distress.  Abdominal: He exhibits no distension.  Musculoskeletal: Normal range of motion.  Able to move thumb at the carpometacarpal joint, slightly movable at the interphalangeal joint  Radial pulse 2+  Neurological: He is alert and oriented to person, place, and  time.  Skin: Skin is warm and dry.  2 cm "L" shaped laceration to the palmar surface of his right thumb, significant swelling and bruising around this area, due to swelling laceration is difficult to reapproximate  Psychiatric: He has a normal mood and affect.  Nursing note and vitals reviewed.    UC Treatments / Results  Labs (all labs ordered are listed, but only abnormal results are displayed) Labs Reviewed - No data to display  EKG None  Radiology Dg Finger Thumb Right  Result Date: 01/05/2018 CLINICAL DATA:  Thumb slammed in car door yesterday. Bruising distally. EXAM: RIGHT THUMB 2+V COMPARISON:  None. FINDINGS: Mild spurring at the interphalangeal joint of the thumb and along the base of the first metacarpal. No fracture or acute bony findings.  No foreign body. IMPRESSION: 1. No acute bony findings.  Mild osteoarthritis. Electronically Signed   By: Van Clines M.D.   On: 01/05/2018 11:57    Procedures Procedures (including critical care time)  Medications Ordered in UC Medications - No data to display  Initial Impression / Assessment and Plan / UC Course  I have reviewed the triage vital signs and the nursing notes.  Pertinent labs & imaging results that were available during my care of the patient were reviewed by me and considered in my medical decision making (see chart for details).     No fracture on x-ray, injury happened yesterday, due to swelling and timing, will defer suturing.  Cleansed wound with sterile water, cleaned with Betadine.  Closed with Steri-Strips, nonadherent dressing.  Discussed wound care.  Tetanus up-to-date.Discussed strict return precautions. Patient verbalized understanding and is agreeable with plan.  Final Clinical Impressions(s) / UC Diagnoses   Final diagnoses:  Injury of right thumb, initial encounter  Laceration of right thumb without foreign body without damage to nail, initial encounter     Discharge Instructions       WOUND CARE . Keep area clean and dry for 24 hours. Do not remove bandage, if applied. . After 24 hours, remove bandage and wash wound gently with mild soap and warm water. Reapply a new bandage after cleaning wound, if directed. . Continue daily cleansing with soap and water  . Notify the office if you experience any of the following signs of infection: Swelling, redness, pus drainage, streaking, fever >101.0 F . Notify the office if you experience excessive bleeding that does not stop after 15-20 minutes of constant, firm pressure.    ED Prescriptions    None     Controlled Substance  Prescriptions [place] Controlled Substance Registry consulted? Not Applicable   Janith Lima, [place] 01/05/18 1903

## 2018-01-05 NOTE — Discharge Instructions (Signed)
WOUND CARE  Keep area clean and dry for 24 hours. Do not remove bandage, if applied.  After 24 hours, remove bandage and wash wound gently with mild soap and warm water. Reapply a new bandage after cleaning wound, if directed.  Continue daily cleansing with soap and water   Notify the office if you experience any of the following signs of infection: Swelling, redness, pus drainage, streaking, fever >101.0 F  Notify the office if you experience excessive bleeding that does not stop after 15-20 minutes of constant, firm pressure.

## 2018-01-05 NOTE — ED Triage Notes (Signed)
Pt states yesterday at 6pm he caught his R thumb in the door, states "its bleeding pretty bad and I thought I needed to get it checked out".

## 2018-01-09 DIAGNOSIS — Z23 Encounter for immunization: Secondary | ICD-10-CM | POA: Diagnosis not present

## 2018-01-24 DIAGNOSIS — J029 Acute pharyngitis, unspecified: Secondary | ICD-10-CM | POA: Diagnosis not present

## 2018-01-24 DIAGNOSIS — J069 Acute upper respiratory infection, unspecified: Secondary | ICD-10-CM | POA: Diagnosis not present

## 2018-02-05 DIAGNOSIS — L02821 Furuncle of head [any part, except face]: Secondary | ICD-10-CM | POA: Diagnosis not present

## 2018-02-05 DIAGNOSIS — B9689 Other specified bacterial agents as the cause of diseases classified elsewhere: Secondary | ICD-10-CM | POA: Diagnosis not present

## 2018-03-01 DIAGNOSIS — M25551 Pain in right hip: Secondary | ICD-10-CM | POA: Diagnosis not present

## 2018-03-01 DIAGNOSIS — R002 Palpitations: Secondary | ICD-10-CM | POA: Diagnosis not present

## 2018-03-01 DIAGNOSIS — R072 Precordial pain: Secondary | ICD-10-CM | POA: Diagnosis not present

## 2018-03-06 DIAGNOSIS — R3129 Other microscopic hematuria: Secondary | ICD-10-CM | POA: Diagnosis not present

## 2018-03-06 DIAGNOSIS — R3982 Chronic bladder pain: Secondary | ICD-10-CM | POA: Diagnosis not present

## 2018-03-06 DIAGNOSIS — M545 Low back pain: Secondary | ICD-10-CM | POA: Diagnosis not present

## 2018-03-27 ENCOUNTER — Ambulatory Visit (HOSPITAL_COMMUNITY)
Admission: EM | Admit: 2018-03-27 | Discharge: 2018-03-27 | Disposition: A | Discharge status: Home / Self Care | Payer: Medicare HMO | Attending: Family Medicine | Admitting: Family Medicine

## 2018-03-27 ENCOUNTER — Encounter (HOSPITAL_COMMUNITY): Payer: Self-pay

## 2018-03-27 DIAGNOSIS — R21 Rash and other nonspecific skin eruption: Secondary | ICD-10-CM | POA: Diagnosis not present

## 2018-03-27 MED ORDER — CLOTRIMAZOLE-BETAMETHASONE 1-0.05 % EX CREA: TOPICAL_CREAM | CUTANEOUS | 1 refills | Status: DC

## 2018-03-27 NOTE — Discharge Instructions (Signed)
Use the lotrisone on the feet an dleg rash This has an anti fungal ( candida) and anti inflammatory action Return as needed

## 2018-03-27 NOTE — ED Triage Notes (Signed)
Pt presents a rash that has spread on his lower legs and feet.

## 2018-03-27 NOTE — ED Provider Notes (Signed)
[place]    CSN: [id number] Arrival date & time: 03/27/18  1124     History   Chief Complaint Chief Complaint  Patient presents with  . Rash    HPI Troy Salinas is a 84 y.o. male.   HPI   Patient is here for a rash to both of his feet and ankles.  He believes that is from "Candida".  He discerned this from reading a magazine.  In the magazine it gave him a home test for Candida, this consisted of spitting into a bottle follow-up with water and analyzing how the saliva acted.  I explained to him that this was not a valid test, and this is not something that he should worry about given the minor rash on his feet and ankles. He is a healthy 84 year old.  He has no other health concerns.  He does not take any medications.  Past Medical History:  Diagnosis Date  . Cancer [facility])    Prostate, managed in [place]  . Hyperlipidemia     Patient Active Problem List   Diagnosis Date Noted  . Glaucoma 12/08/2017  . Skin macule 11/26/2014  . Cataracts, bilateral 11/26/2014  . Junctional rhythm 09/05/2014  . ADENOCARCINOMA, PROSTATE 10/23/2009  . HYPERTROPHY PROSTATE W/O UR OBST & OTH LUTS 05/25/2009    Past Surgical History:  Procedure Laterality Date  . HERNIA REPAIR     had 2       Home Medications    Prior to Admission medications   Medication Sig Start Date End Date Taking? Authorizing Provider  clotrimazole-betamethasone (LOTRISONE) cream Apply to affected area 2 times daily prn 03/27/18   Raylene Everts, MD  OVER THE COUNTER MEDICATION A and C pill Fish oil Latanoprost eye drops clotrimazole    [provider]    Family History Family History  Problem Relation Age of Onset  . Cancer Maternal Grandmother   . Healthy Mother   . Cataracts Mother   . Healthy Maternal Grandfather   . Healthy Paternal Grandmother   . Healthy Paternal Grandfather   . Cancer Sister   . Cancer Brother     Social History Social History   Tobacco Use    . Smoking status: Former Research scientist (life sciences)  . Smokeless tobacco: Never Used  Substance Use Topics  . Alcohol use: Yes    Alcohol/week: 0.0 standard drinks    Comment: social use  . Drug use: No     Allergies   Patient has no known allergies.   Review of Systems Review of Systems  Constitutional: Negative for chills and fever.  HENT: Negative for ear pain and sore throat.   Eyes: Negative for pain and visual disturbance.  Respiratory: Negative for cough and shortness of breath.   Cardiovascular: Negative for chest pain and palpitations.  Gastrointestinal: Negative for abdominal pain and vomiting.  Genitourinary: Negative for dysuria and hematuria.  Musculoskeletal: Negative for arthralgias and back pain.  Skin: Positive for rash. Negative for color change.  Neurological: Negative for seizures and syncope.  All other systems reviewed and are negative.    Physical Exam Triage Vital Signs ED Triage Vitals  Enc Vitals Group     BP 03/27/18 1230 123/90     Pulse Rate 03/27/18 1230 66     Resp 03/27/18 1230 18     Temp 03/27/18 1230 97.6 F (36.4 C)     Temp Source 03/27/18 1230 Oral     SpO2 03/27/18 1230 100 %  Weight --      Height --      Head Circumference --      Peak Flow --      Pain Score 03/27/18 1232 0     Pain Loc --      Pain Edu? --      Excl. in [place]? --    No data found.  Updated Vital Signs BP 123/90 (BP Location: Right Arm)   Pulse 66   Temp 97.6 F (36.4 C) (Oral)   Resp 18   SpO2 100%      Physical Exam  Constitutional: He appears well-developed and well-nourished. No distress.  HENT:  Head: Normocephalic and atraumatic.  Mouth/Throat: Oropharynx is clear and moist.  Eyes: Pupils are equal, round, and reactive to light. Conjunctivae are normal.  Neck: Normal range of motion.  Cardiovascular: Normal rate, regular rhythm and normal heart sounds.  Pulmonary/Chest: Effort normal and breath sounds normal. No respiratory distress.  Abdominal: Soft.  Bowel sounds are normal. He exhibits no distension.  Musculoskeletal: Normal range of motion. He exhibits no edema.  Neurological: He is alert.  Skin: Skin is warm and dry.  Both feet have described dry skin changes especially around the heels.  There are some minor peeling vesiculation between the toes.  The toenails do have signs of onychomycosis.  There is some minor dermatitis of both ankles with small red papules, likely a stasis dermatitis.  Trace edema.  Psychiatric: He has a normal mood and affect. His behavior is normal.     UC Treatments / Results  Labs (all labs ordered are listed, but only abnormal results are displayed) Labs Reviewed - No data to display  EKG None  Radiology No results found.  Procedures Procedures (including critical care time)  Medications Ordered in UC Medications - No data to display  Initial Impression / Assessment and Plan / UC Course  I have reviewed the triage vital signs and the nursing notes.  Pertinent labs & imaging results that were available during my care of the patient were reviewed by me and considered in my medical decision making (see chart for details).     I congratulated the patient on his attempt to educate himself about his medical problem.  I told him that he really should stick with approved medical sites to learn about medical conditions.  I recommended that he use the CDC for infectious diseases, that he use Web MD or a similar medical site for information. Final Clinical Impressions(s) / UC Diagnoses   Final diagnoses:  Rash     Discharge Instructions     Use the lotrisone on the feet an dleg rash This has an anti fungal ( candida) and anti inflammatory action Return as needed   ED Prescriptions    Medication Sig Dispense Auth. Provider   clotrimazole-betamethasone (LOTRISONE) cream Apply to affected area 2 times daily prn 15 g Raylene Everts, MD     Controlled Substance Prescriptions [place] Controlled  Substance Registry consulted? Not Applicable   Raylene Everts, MD 03/27/18 [phone]

## 2018-04-03 DIAGNOSIS — H35341 Macular cyst, hole, or pseudohole, right eye: Secondary | ICD-10-CM | POA: Diagnosis not present

## 2018-04-03 DIAGNOSIS — H401132 Primary open-angle glaucoma, bilateral, moderate stage: Secondary | ICD-10-CM | POA: Diagnosis not present

## 2018-04-03 DIAGNOSIS — H2513 Age-related nuclear cataract, bilateral: Secondary | ICD-10-CM | POA: Diagnosis not present

## 2018-04-12 DIAGNOSIS — R072 Precordial pain: Secondary | ICD-10-CM | POA: Diagnosis not present

## 2018-04-12 DIAGNOSIS — I351 Nonrheumatic aortic (valve) insufficiency: Secondary | ICD-10-CM | POA: Diagnosis not present

## 2018-04-12 DIAGNOSIS — I361 Nonrheumatic tricuspid (valve) insufficiency: Secondary | ICD-10-CM | POA: Diagnosis not present

## 2018-04-12 DIAGNOSIS — I340 Nonrheumatic mitral (valve) insufficiency: Secondary | ICD-10-CM | POA: Diagnosis not present

## 2018-04-24 DIAGNOSIS — H2511 Age-related nuclear cataract, right eye: Secondary | ICD-10-CM | POA: Diagnosis not present

## 2018-04-24 DIAGNOSIS — H35371 Puckering of macula, right eye: Secondary | ICD-10-CM | POA: Diagnosis not present

## 2018-04-24 DIAGNOSIS — H35361 Drusen (degenerative) of macula, right eye: Secondary | ICD-10-CM | POA: Diagnosis not present

## 2018-04-24 DIAGNOSIS — H33101 Unspecified retinoschisis, right eye: Secondary | ICD-10-CM | POA: Diagnosis not present

## 2018-04-24 DIAGNOSIS — H353131 Nonexudative age-related macular degeneration, bilateral, early dry stage: Secondary | ICD-10-CM | POA: Diagnosis not present

## 2018-06-05 ENCOUNTER — Ambulatory Visit: Payer: Medicare HMO | Admitting: Family Medicine

## 2018-06-07 DIAGNOSIS — I517 Cardiomegaly: Secondary | ICD-10-CM | POA: Diagnosis not present

## 2018-06-07 DIAGNOSIS — R740 Nonspecific elevation of levels of transaminase and lactic acid dehydrogenase [LDH]: Secondary | ICD-10-CM | POA: Diagnosis not present

## 2018-06-07 DIAGNOSIS — I10 Essential (primary) hypertension: Secondary | ICD-10-CM | POA: Diagnosis not present

## 2018-06-07 DIAGNOSIS — B351 Tinea unguium: Secondary | ICD-10-CM | POA: Diagnosis not present

## 2018-06-14 ENCOUNTER — Ambulatory Visit: Payer: Medicare HMO

## 2018-06-16 DIAGNOSIS — H9201 Otalgia, right ear: Secondary | ICD-10-CM | POA: Diagnosis not present

## 2018-06-19 DIAGNOSIS — H6122 Impacted cerumen, left ear: Secondary | ICD-10-CM | POA: Diagnosis not present

## 2018-06-21 DIAGNOSIS — Z0000 Encounter for general adult medical examination without abnormal findings: Secondary | ICD-10-CM | POA: Diagnosis not present

## 2018-06-21 DIAGNOSIS — I10 Essential (primary) hypertension: Secondary | ICD-10-CM | POA: Diagnosis not present

## 2018-06-21 DIAGNOSIS — B351 Tinea unguium: Secondary | ICD-10-CM | POA: Diagnosis not present
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
## 2018-12-13 DIAGNOSIS — Z23 Encounter for immunization: Secondary | ICD-10-CM | POA: Diagnosis not present

## 2019-01-04 ENCOUNTER — Encounter (HOSPITAL_COMMUNITY): Payer: Self-pay | Admitting: Family Medicine

## 2019-01-04 ENCOUNTER — Ambulatory Visit (HOSPITAL_COMMUNITY)
Admission: EM | Admit: 2019-01-04 | Discharge: 2019-01-04 | Disposition: A | Discharge status: Home / Self Care | Payer: Medicare HMO

## 2019-01-04 DIAGNOSIS — T148XXA Other injury of unspecified body region, initial encounter: Secondary | ICD-10-CM | POA: Diagnosis not present

## 2019-01-04 MED ORDER — ACETAMINOPHEN 500 MG PO TABS: 500.0000 mg | ORAL_TABLET | Freq: Four times a day (QID) | ORAL | 0 refills | Status: DC | PRN

## 2019-01-04 MED ORDER — TIZANIDINE HCL 4 MG PO TABS: 4.0000 mg | ORAL_TABLET | Freq: Four times a day (QID) | ORAL | 0 refills | Status: DC | PRN

## 2019-01-04 NOTE — Discharge Instructions (Addendum)
I believe this is all soreness in the muscles from overuse playing tennis. We will try a low-dose muscle relaxant to use as needed  Tylenol extra strength for pain heat, stretching and massage may help Follow up as needed for continued or worsening symptoms

## 2019-01-04 NOTE — ED Provider Notes (Signed)
[place]    CSN: [id number] Arrival date & time: 01/04/19  1204      History   Chief Complaint Chief Complaint  Patient presents with  . Pain    HPI Troy Salinas is a 85 y.o. male.   Pt is an 85 year old male that presents with lateral neck pain, upper back pain and shoulder pain. This has been present since after playing tennis 3 days ago. He is right handed. The pain is mostly on the left side but started on the right. Worse with movement. Took tylenol with some relief.  Denies any associated weakness, numbness or tingling in extremities.  No chest pain or shortness of breath.  No specific injuries or falls.  ROS per HPI      Past Medical History:  Diagnosis Date  . Cancer [facility])    Prostate, managed in [place]  . Hyperlipidemia     Patient Active Problem List   Diagnosis Date Noted  . Glaucoma 12/08/2017  . Skin macule 11/26/2014  . Cataracts, bilateral 11/26/2014  . Junctional rhythm 09/05/2014  . ADENOCARCINOMA, PROSTATE 10/23/2009  . HYPERTROPHY PROSTATE W/O UR OBST & OTH LUTS 05/25/2009    Past Surgical History:  Procedure Laterality Date  . HERNIA REPAIR     had 2       Home Medications    Prior to Admission medications   Medication Sig Start Date End Date Taking? Authorizing Provider  acetaminophen (TYLENOL) 500 MG tablet Take 1 tablet (500 mg total) by mouth every 6 (six) hours as needed. 01/04/19   Orvan July, NP  clotrimazole-betamethasone (LOTRISONE) cream Apply to affected area 2 times daily prn 03/27/18   Raylene Everts, MD  latanoprost (XALATAN) 0.005 % ophthalmic solution Place 1 drop into both eyes at bedtime. 11/15/18   [provider]  Multiple Vitamin (MULTI-VITAMIN) tablet Take by mouth.    [provider]  OVER THE COUNTER MEDICATION A and C pill Fish oil Latanoprost eye drops clotrimazole    [provider]  tiZANidine (ZANAFLEX) 4 MG tablet Take 1 tablet (4 mg total) by mouth every 6  (six) hours as needed for muscle spasms. 01/04/19   Orvan July, NP    Family History Family History  Problem Relation Age of Onset  . Cancer Maternal Grandmother   . Healthy Mother   . Cataracts Mother   . Healthy Maternal Grandfather   . Healthy Paternal Grandmother   . Healthy Paternal Grandfather   . Cancer Sister   . Cancer Brother     Social History Social History   Tobacco Use  . Smoking status: Former Research scientist (life sciences)  . Smokeless tobacco: Never Used  Substance Use Topics  . Alcohol use: Yes    Alcohol/week: 0.0 standard drinks    Comment: social use  . Drug use: No     Allergies   Patient has no known allergies.   Review of Systems Review of Systems   Physical Exam Triage Vital Signs ED Triage Vitals  Enc Vitals Group     BP 01/04/19 1242 (!) 163/91     Pulse Rate 01/04/19 1242 73     Resp 01/04/19 1242 16     Temp 01/04/19 1242 98.4 F (36.9 C)     Temp Source 01/04/19 1242 Oral     SpO2 01/04/19 1242 99 %     Weight --      Height --      Head Circumference --  Peak Flow --      Pain Score 01/04/19 1241 9     Pain Loc --      Pain Edu? --      Excl. in [place]? --    No data found.  Updated Vital Signs BP (!) 163/91 (BP Location: Left Arm)   Pulse 73   Temp 98.4 F (36.9 C) (Oral)   Resp 16   SpO2 99%   Visual Acuity Right Eye Distance:   Left Eye Distance:   Bilateral Distance:    Right Eye Near:   Left Eye Near:    Bilateral Near:     Physical Exam Vitals signs and nursing note reviewed.  Constitutional:      Appearance: Normal appearance.  HENT:     Head: Normocephalic and atraumatic.     Nose: Nose normal.  Eyes:     Conjunctiva/sclera: Conjunctivae normal.  Neck:     Musculoskeletal: Normal range of motion.  Pulmonary:     Effort: Pulmonary effort is normal.  Musculoskeletal: Normal range of motion.     Right shoulder: Normal.     Cervical back: He exhibits tenderness. He exhibits normal range of motion, no bony  tenderness, no swelling, no edema, no deformity, no laceration, no pain, no spasm and normal pulse.       Back:       Arms:  Skin:    General: Skin is warm and dry.  Neurological:     Mental Status: He is alert.  Psychiatric:        Mood and Affect: Mood normal.      UC Treatments / Results  Labs (all labs ordered are listed, but only abnormal results are displayed) Labs Reviewed - No data to display  EKG   Radiology No results found.  Procedures Procedures (including critical care time)  Medications Ordered in UC Medications - No data to display  Initial Impression / Assessment and Plan / UC Course  I have reviewed the triage vital signs and the nursing notes.  Pertinent labs & imaging results that were available during my care of the patient were reviewed by me and considered in my medical decision making (see chart for details).     Patient is 85 year old male who is otherwise healthy.  Presenting with generalized muscle soreness to neck/back and left shoulder area. Pain started after an intense tennis lesson. Pain is worse with movement.  Denies any chest pain or shortness of breath. Took Tylenol was some relief. Most likely muscle soreness from overuse Will treat with low-dose muscle relaxant and have him take extra strength Tylenol for pain.  Recommended gentle stretching and heat Follow-up if symptoms continue or worsen.  Final Clinical Impressions(s) / UC Diagnoses   Final diagnoses:  Muscle strain     Discharge Instructions     I believe this is all soreness in the muscles from overuse playing tennis. We will try a low-dose muscle relaxant to use as needed  Tylenol extra strength for pain heat, stretching and massage may help Follow up as needed for continued or worsening symptoms      ED Prescriptions    Medication Sig Dispense Auth. Provider   tiZANidine (ZANAFLEX) 4 MG tablet Take 1 tablet (4 mg total) by mouth every 6 (six) hours as  needed for muscle spasms. 30 tablet Jamell Laymon A, NP   acetaminophen (TYLENOL) 500 MG tablet Take 1 tablet (500 mg total) by mouth every 6 (six) hours as needed. [place]  tablet Orvan July, NP     PDMP not reviewed this encounter.   Orvan July, NP 01/04/19 1359

## 2019-01-04 NOTE — ED Triage Notes (Signed)
Patient report he was playing tennis 2 days ago 01/02/2019 and he started having pain in his shoulder blades. He took Tylenol for the pain.

## 2019-04-15 DIAGNOSIS — B356 Tinea cruris: Secondary | ICD-10-CM | POA: Diagnosis not present

## 2019-05-08 ENCOUNTER — Ambulatory Visit: Payer: Medicare Other | Attending: Internal Medicine

## 2019-05-08 DIAGNOSIS — Z23 Encounter for immunization: Secondary | ICD-10-CM | POA: Insufficient documentation

## 2019-05-08 NOTE — Progress Notes (Signed)
   Covid-19 Vaccination Clinic  Name:  Troy Salinas    MRN: [id number] DOB: 06-07-1931  05/08/2019  [name] was observed post Covid-19 immunization for 15 minutes without incidence. He was provided with Vaccine Information Sheet and instruction to access the V-Safe system.   [name] was instructed to call 911 with any severe reactions post vaccine: Marland Kitchen Difficulty breathing  . Swelling of your face and throat  . A fast heartbeat  . A bad rash all over your body  . Dizziness and weakness    Immunizations Administered    Name Date Dose VIS Date Route   Pfizer COVID-19 Vaccine 05/08/2019  2:25 PM 0.3 mL 03/29/2019 Intramuscular   Manufacturer: [place]   Lot: [id number]   [place]: [id number]

## 2019-05-28 ENCOUNTER — Ambulatory Visit: Payer: Medicare HMO | Attending: Internal Medicine

## 2019-05-28 ENCOUNTER — Ambulatory Visit: Payer: Medicare HMO

## 2019-05-28 DIAGNOSIS — Z23 Encounter for immunization: Secondary | ICD-10-CM | POA: Insufficient documentation

## 2019-05-28 NOTE — Progress Notes (Signed)
   Covid-19 Vaccination Clinic  Name:  Jontel Yeager    MRN: [id number] DOB: 1931/12/06  05/28/2019  Mr. Sugimoto was observed post Covid-19 immunization for 15 minutes without incidence. He was provided with Vaccine Information Sheet and instruction to access the V-Safe system.   Mr. Buchholtz was instructed to call 911 with any severe reactions post vaccine: Marland Kitchen Difficulty breathing  . Swelling of your face and throat  . A fast heartbeat  . A bad rash all over your body  . Dizziness and weakness    Immunizations Administered    Name Date Dose VIS Date Route   Pfizer COVID-19 Vaccine 05/28/2019  8:13 AM 0.3 mL 03/29/2019 Intramuscular   Manufacturer: [place]   Lot: [id number]   [place]: [id number]

## 2019-06-10 ENCOUNTER — Ambulatory Visit: Payer: Medicare HMO

## 2019-10-07 ENCOUNTER — Encounter (HOSPITAL_COMMUNITY): Payer: Self-pay

## 2019-10-07 ENCOUNTER — Other Ambulatory Visit: Payer: Self-pay

## 2019-10-07 ENCOUNTER — Ambulatory Visit (HOSPITAL_COMMUNITY)
Admission: EM | Admit: 2019-10-07 | Discharge: 2019-10-07 | Disposition: A | Discharge status: Home / Self Care | Payer: Medicare HMO | Attending: Family Medicine | Admitting: Family Medicine

## 2019-10-07 DIAGNOSIS — R030 Elevated blood-pressure reading, without diagnosis of hypertension: Secondary | ICD-10-CM

## 2019-10-07 DIAGNOSIS — N4889 Other specified disorders of penis: Secondary | ICD-10-CM | POA: Diagnosis not present

## 2019-10-07 NOTE — ED Provider Notes (Addendum)
[place]   [id number] 10/07/19 Arrival Time: 2263  ASSESSMENT & PLAN:  1. Penile irritation   2. Elevated blood pressure reading without diagnosis of hypertension       Discharge Instructions     Begin using your clotrimazoleclotrimazole-betamethasone (LOTRISONE) twice daily for up to one week.  Your blood pressure was noted to be elevated during your visit today. If you are currently taking medication for high blood pressure, please ensure you are taking this as directed. If you do not have a history of high blood pressure and your blood pressure remains persistently elevated, you may need to begin taking a medication at some point. You may return here within the next few days to recheck if unable to see your primary care provider or if do not have a one.  BP (!) 155/72 (BP Location: Left Arm)   Pulse 61   Temp 98.1 F (36.7 C) (Oral)   Resp 16   SpO2 99%      Relates increased BP to being irritated with someone in the waiting room.   Reviewed expectations re: course of current medical issues. Questions answered. Outlined signs and symptoms indicating need for more acute intervention. Patient verbalized understanding. After Visit Summary given.   SUBJECTIVE:  Troy Salinas is a 86 y.o. male who reports long-standing area on penis "that won't heal" after prostate procedure several years ago. Decided to put iodine on penis 3 d ago with resulting skin irritation/pain/erythema. No penile discharge. Urinating normally. Afebrile.  Has been given Lotrisone cream for similar in the past but has not used recently.  OBJECTIVE:  Vitals:   10/07/19 0931  BP: (!) 155/72  Pulse: 61  Resp: 16  Temp: 98.1 F (36.7 C)  TempSrc: Oral  SpO2: 99%     General appearance: alert, cooperative, appears stated age and no distress GU: significant weepy erythema around glans penis; without obvious ulcerations or open wounds; tender to touch Skin: warm and  dry Psychological: alert and cooperative; normal mood and affect.  No Known Allergies  Past Medical History:  Diagnosis Date  . Cancer [facility])    Prostate, managed in [place]  . Hyperlipidemia    Family History  Problem Relation Age of Onset  . Cancer Maternal Grandmother   . Healthy Mother   . Cataracts Mother   . Healthy Maternal Grandfather   . Healthy Paternal Grandmother   . Healthy Paternal Grandfather   . Cancer Sister   . Cancer Brother    Social History   Socioeconomic History  . Marital status: Divorced    Spouse name: Not on file  . Number of children: 1  . Years of education: 45  . Highest education level: Not on file  Occupational History  . Occupation: Retired  Tobacco Use  . Smoking status: Former Research scientist (life sciences)  . Smokeless tobacco: Never Used  Substance and Sexual Activity  . Alcohol use: Yes    Alcohol/week: 0.0 standard drinks    Comment: social use  . Drug use: No  . Sexual activity: Not on file  Other Topics Concern  . Not on file  Social History Narrative   Fun: Plays tennis   Feels safe at home and denies abuse   Social Determinants of Health   Financial Resource Strain:   . Difficulty of Paying Living Expenses:   Food Insecurity:   . Worried About Charity fundraiser in the Last Year:   . [place] in the Last Year:  Transportation Needs:   . Film/video editor (Medical):   Marland Kitchen Lack of Transportation (Non-Medical):   Physical Activity:   . Days of Exercise per Week:   . Minutes of Exercise per Session:   Stress:   . Feeling of Stress :   Social Connections:   . Frequency of Communication with Friends and Family:   . Frequency of Social Gatherings with Friends and Family:   . Attends Religious Services:   . Active Member of Clubs or Organizations:   . Attends Archivist Meetings:   Marland Kitchen Marital Status:   Intimate Partner Violence:   . Fear of Current or Ex-Partner:   . Emotionally Abused:   Marland Kitchen Physically Abused:   .  Sexually Abused:           Vanessa Kick, MD 10/07/19 8185    Vanessa Kick, MD 10/07/19 [place] Glaze    Vanessa Kick, MD 10/07/19 1239

## 2019-10-07 NOTE — Discharge Instructions (Addendum)
Begin using your clotrimazoleclotrimazole-betamethasone (LOTRISONE) twice daily for up to one week.  Your blood pressure was noted to be elevated during your visit today. If you are currently taking medication for high blood pressure, please ensure you are taking this as directed. If you do not have a history of high blood pressure and your blood pressure remains persistently elevated, you may need to begin taking a medication at some point. You may return here within the next few days to recheck if unable to see your primary care provider or if do not have a one.  BP (!) 155/72 (BP Location: Left Arm)   Pulse 61   Temp 98.1 F (36.7 C) (Oral)   Resp 16   SpO2 99%

## 2019-10-07 NOTE — ED Triage Notes (Signed)
Pt reports having burning sensation and swelling in his penis x 3 days after he used iodine. Pt states his penis never healed in the past 3 years after he had some procedure done, he can not remember the name.

## 2020-05-22 ENCOUNTER — Encounter (HOSPITAL_COMMUNITY): Payer: Self-pay | Admitting: Emergency Medicine

## 2020-05-22 ENCOUNTER — Other Ambulatory Visit: Payer: Self-pay

## 2020-05-22 ENCOUNTER — Ambulatory Visit (HOSPITAL_COMMUNITY)
Admission: EM | Admit: 2020-05-22 | Discharge: 2020-05-22 | Disposition: A | Discharge status: Home / Self Care | Payer: Medicare HMO

## 2020-05-22 DIAGNOSIS — F411 Generalized anxiety disorder: Secondary | ICD-10-CM | POA: Diagnosis not present

## 2020-05-22 NOTE — ED Triage Notes (Signed)
Pt presents with insomnia. States has a lot of stress on him and feels as if its causing him to not be able to sleep. States wakes up in cold sweats during the night.
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
## 2021-06-28 ENCOUNTER — Ambulatory Visit (HOSPITAL_COMMUNITY)
Admission: EM | Admit: 2021-06-28 | Discharge: 2021-06-28 | Disposition: A | Discharge status: Home / Self Care | Payer: Medicare HMO | Attending: Emergency Medicine | Admitting: Emergency Medicine

## 2021-06-28 ENCOUNTER — Encounter (HOSPITAL_COMMUNITY): Payer: Self-pay

## 2021-06-28 ENCOUNTER — Other Ambulatory Visit: Payer: Self-pay

## 2021-06-28 DIAGNOSIS — R21 Rash and other nonspecific skin eruption: Secondary | ICD-10-CM

## 2021-06-28 DIAGNOSIS — B372 Candidiasis of skin and nail: Secondary | ICD-10-CM | POA: Diagnosis not present

## 2021-06-28 MED ORDER — FLUCONAZOLE 200 MG PO TABS: 200.0000 mg | ORAL_TABLET | Freq: Every day | ORAL | 0 refills | Status: AC

## 2021-06-28 MED ORDER — BETAMETHASONE DIPROPIONATE 0.05 % EX OINT: TOPICAL_OINTMENT | Freq: Two times a day (BID) | CUTANEOUS | 0 refills | Status: DC

## 2021-06-28 NOTE — ED Triage Notes (Signed)
Pt c/o itchy rash to neck, back of head, and around ears for over a week.  ?

## 2021-06-28 NOTE — ED Provider Notes (Signed)
?[place] ? ? ? ?CSN: [id number] ?Arrival date & time: 06/28/21  1700 ? ? ?  ? ?History   ?Chief Complaint ?Chief Complaint  ?Patient presents with  ? Rash  ? ? ?HPI ?Troy Salinas is a 88 y.o. male.  ? ?Patient presents today with a rash/itching area to the back and nape of neck and around ears bilateral.  Patient states he has noticed this for the past week.  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  Denies any pain if just ear tightening and itching.  No changes to soaps or detergents. ? ? ?Past Medical History:  ?Diagnosis Date  ? Cancer [facility])   ? Prostate, managed in [place]  ? Hyperlipidemia   ? ? ?Patient Active Problem List  ? Diagnosis Date Noted  ? Glaucoma 12/08/2017  ? Skin macule 11/26/2014  ? Cataracts, bilateral 11/26/2014  ? Junctional rhythm 09/05/2014  ? ADENOCARCINOMA, PROSTATE 10/23/2009  ? HYPERTROPHY PROSTATE W/O UR OBST & OTH LUTS 05/25/2009  ? ? ?Past Surgical History:  ?Procedure Laterality Date  ? HERNIA REPAIR    ? had 2  ? ? ? ? ? ?Home Medications   ? ?Prior to Admission medications   ?Medication Sig Start Date End Date Taking? Authorizing Provider  ?betamethasone dipropionate (DIPROLENE) 0.05 % ointment Apply topically 2 (two) times daily. 06/28/21  Yes Marney Setting, NP  ?fluconazole (DIFLUCAN) 200 MG tablet Take 1 tablet (200 mg total) by mouth daily for 7 days. 06/28/21 07/05/21 Yes Marney Setting, NP  ?acetaminophen (TYLENOL) 500 MG tablet Take 1 tablet (500 mg total) by mouth every 6 (six) hours as needed. 01/04/19   Orvan July, NP  ?clotrimazole-betamethasone (LOTRISONE) cream Apply to affected area 2 times daily prn 03/27/18   Raylene Everts, MD  ?latanoprost (XALATAN) 0.005 % ophthalmic solution Place 1 drop into both eyes at bedtime. 11/15/18   [provider]  ?Multiple Vitamin (MULTI-VITAMIN) tablet Take by mouth.    [provider]  ?mupirocin cream (BACTROBAN) 2 % Apply 1 application topically 2 (two)  times daily.    [provider]  ?nystatin cream (MYCOSTATIN) Apply 1 application topically 2 (two) times daily.    [provider]  ?OVER THE COUNTER MEDICATION A and C pill ?Fish oil ?Latanoprost eye drops ?clotrimazole    [provider]  ?tiZANidine (ZANAFLEX) 4 MG tablet Take 1 tablet (4 mg total) by mouth every 6 (six) hours as needed for muscle spasms. 01/04/19   Orvan July, NP  ? ? ?Family History ?Family History  ?Problem Relation Age of Onset  ? Cancer Maternal Grandmother   ? Healthy Mother   ? Cataracts Mother   ? Healthy Maternal Grandfather   ? Healthy Paternal Grandmother   ? Healthy Paternal Grandfather   ? Cancer Sister   ? Cancer Brother   ? ? ?Social History ?Social History  ? ?Tobacco Use  ? Smoking status: Former  ? Smokeless tobacco: Never  ?Substance Use Topics  ? Alcohol use: Yes  ?  Alcohol/week: 0.0 standard drinks  ?  Comment: social use  ? Drug use: No  ? ? ? ?Allergies   ?Patient has no known allergies. ? ? ?Review of Systems ?Review of Systems  ?Constitutional:  Negative for fatigue and fever.  ?Eyes: Negative.   ?Respiratory: Negative.    ?Cardiovascular: Negative.   ?Gastrointestinal: Negative.   ?Skin:  Positive for rash. Negative  for color change and wound.  ?Neurological: Negative.   ? ? ?Physical Exam ?Triage Vital Signs ?ED Triage Vitals  ?Enc Vitals Group  ?   BP 06/28/21 1105 (!) 168/63  ?   Pulse Rate 06/28/21 1105 (!) 55  ?   Resp 06/28/21 1105 18  ?   Temp 06/28/21 1105 97.7 ?F (36.5 ?C)  ?   Temp Source 06/28/21 1105 Oral  ?   SpO2 06/28/21 1105 98 %  ?   Weight --   ?   Height --   ?   Head Circumference --   ?   Peak Flow --   ?   Pain Score 06/28/21 1103 0  ?   Pain Loc --   ?   Pain Edu? --   ?   Excl. in [place]? --   ? ?No data found. ? ?Updated Vital Signs ?BP (!) 168/63 (BP Location: Left Arm)   Pulse (!) 55   Temp 97.7 ?F (36.5 ?C) (Oral)   Resp 18   SpO2 98%  ? ?Visual Acuity ?Right Eye Distance:   ?Left Eye Distance:   ?Bilateral  Distance:   ? ?Right Eye Near:   ?Left Eye Near:    ?Bilateral Near:    ? ?Physical Exam ?Constitutional:   ?   Appearance: Normal appearance.  ?HENT:  ?   Right Ear: Tympanic membrane normal.  ?   Left Ear: Tympanic membrane normal.  ?Cardiovascular:  ?   Rate and Rhythm: Normal rate.  ?Pulmonary:  ?   Effort: Pulmonary effort is normal.  ?Abdominal:  ?   General: Abdomen is flat.  ?Skin: ?   Findings: Rash present. No erythema.  ?   Comments: Nap of neck has a flat dry appearance area same to bilateral back of the ears.  No open wounds no blisters  ?Neurological:  ?   General: No focal deficit present.  ?   Mental Status: He is alert.  ? ? ? ?UC Treatments / Results  ?Labs ?(all labs ordered are listed, but only abnormal results are displayed) ?Labs Reviewed - No data to display ? ?EKG ? ? ?Radiology ?No results found. ? ?Procedures ?Procedures (including critical care time) ? ?Medications Ordered in UC ?Medications - No data to display ? ?Initial Impression / Assessment and Plan / UC Course  ?I have reviewed the triage vital signs and the nursing notes. ? ?Pertinent labs & imaging results that were available during my care of the patient were reviewed by me and considered in my medical decision making (see chart for details). ? ?  ? ?Wash dry area very well apply cream twice a day ?May apply A&E ointment to area if it appears to be dry ?This appears more yeast/fungal in appearance can come from wearing caps.  Make sure to wash As well especially after sweating profusely. ?Follow-up with your PCP if symptoms persist you may need to see dermatology. ?Final Clinical Impressions(s) / UC Diagnoses  ? ?Final diagnoses:  ?Rash and nonspecific skin eruption  ?Yeast infection of the skin  ? ? ? ?Discharge Instructions   ? ?  ?Wash dry area very well apply cream twice a day ?May apply A&E ointment to area if it appears to be dry ?This appears more yeast/fungal in appearance can come from wearing caps.  Make sure to wash As  well especially after sweating profusely. ?Follow-up with your PCP if symptoms persist you may need to see dermatology ? ? ? ? ?  ED Prescriptions   ? ? Medication Sig Dispense Auth. Provider  ? fluconazole (DIFLUCAN) 200 MG tablet Take 1 tablet (200 mg total) by mouth daily for 7 days. 7 tablet Morley Kos L, NP  ? betamethasone dipropionate (DIPROLENE) 0.05 % ointment Apply topically 2 (two) times daily. 30 g Marney Setting, NP  ? ?  ? ?PDMP not reviewed this encounter. ?  ?Marney Setting, NP ?06/28/21 1227 ? ?

## 2021-06-28 NOTE — Discharge Instructions (Addendum)
Wash dry area very well apply cream twice a day ?May apply A&E ointment to area if it appears to be dry ?This appears more yeast/fungal in appearance can come from wearing caps.  Make sure to wash As well especially after sweating profusely. ?Follow-up with your PCP if symptoms persist you may need to see dermatology ?

## 2021-08-04 ENCOUNTER — Ambulatory Visit: Payer: Medicare HMO | Admitting: Family Medicine

## 2021-08-27 ENCOUNTER — Ambulatory Visit: Payer: Self-pay

## 2021-08-27 NOTE — Telephone Encounter (Signed)
?  Chief Complaint: hip pain ?Symptoms: R hip pain radiates down into knee ?Frequency: 2 weeks ?Pertinent Negatives: NA ?Disposition: '[]'$ ED /'[x]'$ Urgent Care (no appt availability in office) / '[]'$ Appointment(In office/virtual)/ '[]'$  [place] [facility]/ '[]'$ Home Care/ '[]'$ Refused Recommended Disposition /'[]'$ [place] [facility]/ '[]'$  Follow-up with PCP ?Additional Notes: pt went to PCP but didn't get much help so was calling to be seen today at new office. Advised him that new patient appt would be scheduled out for several weeks. Pt states he would go to walk-in clinic today to be seen and advised him he could go to [facility]. Pt was provided with location and phone number. Pt states he will go there today.  ? ?Reason for Disposition ? [1] SEVERE pain (e.g., excruciating, unable to do any normal activities) AND [2] not improved after 2 hours of pain medicine ? ?Answer Assessment - Initial Assessment Questions ?1. LOCATION and RADIATION: "Where is the pain located?"  ?    R hip into R knee ?3. SEVERITY: "How bad is the pain?" "What does it keep you from doing?"   (Scale 1-10; or mild, moderate, severe) ?  -  MILD (1-3): doesn't interfere with normal activities  ?  -  MODERATE (4-7): interferes with normal activities (e.g., work or school) or awakens from sleep, limping  ?  -  SEVERE (8-10): excruciating pain, unable to do any normal activities, unable to walk ?    7/8 ?4. ONSET: "When did the pain start?" "Does it come and go, or is it there all the time?" ?    2 weeks ? ?7. AGGRAVATING FACTORS: "What makes the hip pain worse?" (e.g., walking, climbing stairs, running) ?    Pulling it up to put a sock on ?8. OTHER SYMPTOMS: "Do you have any other symptoms?" (e.g., back pain, pain shooting down leg,  fever, rash) ?    Running down leg into knee ? ?Protocols used: Hip Pain-A-AH ? ?

## 2021-08-30 ENCOUNTER — Encounter (HOSPITAL_COMMUNITY): Payer: Self-pay | Admitting: Emergency Medicine

## 2021-08-30 ENCOUNTER — Ambulatory Visit (HOSPITAL_COMMUNITY)
Admission: EM | Admit: 2021-08-30 | Discharge: 2021-08-30 | Disposition: A | Discharge status: Home / Self Care | Payer: Medicare HMO | Attending: Student | Admitting: Student

## 2021-08-30 ENCOUNTER — Ambulatory Visit (INDEPENDENT_AMBULATORY_CARE_PROVIDER_SITE_OTHER): Payer: Medicare HMO

## 2021-08-30 DIAGNOSIS — M25551 Pain in right hip: Secondary | ICD-10-CM

## 2021-08-30 DIAGNOSIS — M1611 Unilateral primary osteoarthritis, right hip: Secondary | ICD-10-CM

## 2021-08-30 MED ORDER — PREDNISONE 50 MG PO TABS: 50.0000 mg | ORAL_TABLET | Freq: Every day | ORAL | 0 refills | Status: AC

## 2021-08-30 NOTE — ED Triage Notes (Signed)
Pt is present today with concerns for right hip pain that radiates down to his knee. Pt states pain started x3 weeks  ?

## 2021-08-30 NOTE — Discharge Instructions (Addendum)
-  You have arthritis of the right hip. Please take prednisone one pill daily x3 days. You can take tylenol for additional relief ?-Follow-up with your primary care provider. They may refer you to an orthopedist.  ?

## 2021-08-30 NOTE — ED Provider Notes (Signed)
?[place] ? ? ? ?CSN: [id number] ?Arrival date & time: 08/30/21  1322 ? ? ?  ? ?History   ?Chief Complaint ?Chief Complaint  ?Patient presents with  ? Hip Pain  ? ? ?HPI ?Troy Salinas is a 88 y.o. male presenting with right hip pain for 3 weeks.  History adenocarcinoma prostate, hiatal hernia repair.  Describes pain from the groin radiating down the anterior right thigh for about 3 weeks.  There is minimal pain at rest, but with standing and flexing the hip he has significant pain.  Denies any falls or overuse, though he does typically play tennis twice weekly.  He has not played tennis in the last 3 weeks.  There is no new weakness in the arms of the legs, there is no new midline back pain, no new sensation changes in the arms of the legs.  Denies urinary symptoms. ? ?HPI ? ?Past Medical History:  ?Diagnosis Date  ? Cancer [facility])   ? Prostate, managed in [place]  ? Hyperlipidemia   ? ? ?Patient Active Problem List  ? Diagnosis Date Noted  ? Glaucoma 12/08/2017  ? Skin macule 11/26/2014  ? Cataracts, bilateral 11/26/2014  ? Junctional rhythm 09/05/2014  ? ADENOCARCINOMA, PROSTATE 10/23/2009  ? HYPERTROPHY PROSTATE W/O UR OBST & OTH LUTS 05/25/2009  ? ? ?Past Surgical History:  ?Procedure Laterality Date  ? HERNIA REPAIR    ? had 2  ? ? ? ? ? ?Home Medications   ? ?Prior to Admission medications   ?Medication Sig Start Date End Date Taking? Authorizing Provider  ?predniSONE (DELTASONE) 50 MG tablet Take 1 tablet (50 mg total) by mouth daily for 3 days. 08/30/21 09/02/21 Yes Hazel Sams, PA-C  ?acetaminophen (TYLENOL) 500 MG tablet Take 1 tablet (500 mg total) by mouth every 6 (six) hours as needed. 01/04/19   Loura Halt A, NP  ?betamethasone dipropionate (DIPROLENE) 0.05 % ointment Apply topically 2 (two) times daily. 06/28/21   Marney Setting, NP  ?clotrimazole-betamethasone (LOTRISONE) cream Apply to affected area 2 times daily prn 03/27/18   Raylene Everts, MD  ?latanoprost (XALATAN) 0.005 %  ophthalmic solution Place 1 drop into both eyes at bedtime. 11/15/18   [provider]  ?Multiple Vitamin (MULTI-VITAMIN) tablet Take by mouth.    [provider]  ?mupirocin cream (BACTROBAN) 2 % Apply 1 application topically 2 (two) times daily.    [provider]  ?nystatin cream (MYCOSTATIN) Apply 1 application topically 2 (two) times daily.    [provider]  ?OVER THE COUNTER MEDICATION A and C pill ?Fish oil ?Latanoprost eye drops ?clotrimazole    [provider]  ?tiZANidine (ZANAFLEX) 4 MG tablet Take 1 tablet (4 mg total) by mouth every 6 (six) hours as needed for muscle spasms. 01/04/19   Orvan July, NP  ? ? ?Family History ?Family History  ?Problem Relation Age of Onset  ? Cancer Maternal Grandmother   ? Healthy Mother   ? Cataracts Mother   ? Healthy Maternal Grandfather   ? Healthy Paternal Grandmother   ? Healthy Paternal Grandfather   ? Cancer Sister   ? Cancer Brother   ? ? ?Social History ?Social History  ? ?Tobacco Use  ? Smoking status: Former  ? Smokeless tobacco: Never  ?Substance Use Topics  ? Alcohol use: Yes  ?  Alcohol/week: 0.0 standard drinks  ?  Comment: social use  ? Drug use: No  ? ? ? ?Allergies   ?Patient has  no known allergies. ? ? ?Review of Systems ?Review of Systems  ?Musculoskeletal:   ?     R hip pain   ?All other systems reviewed and are negative. ? ? ?Physical Exam ?Triage Vital Signs ?ED Triage Vitals  ?Enc Vitals Group  ?   BP 08/30/21 1435 136/63  ?   Pulse Rate 08/30/21 1435 73  ?   Resp 08/30/21 1435 17  ?   Temp 08/30/21 1435 (!) 97.5 ?F (36.4 ?C)  ?   Temp Source 08/30/21 1435 Oral  ?   SpO2 08/30/21 1435 100 %  ?   Weight --   ?   Height --   ?   Head Circumference --   ?   Peak Flow --   ?   Pain Score 08/30/21 1434 8  ?   Pain Loc --   ?   Pain Edu? --   ?   Excl. in [place]? --   ? ?No data found. ? ?Updated Vital Signs ?BP 136/63   Pulse 73   Temp (!) 97.5 ?F (36.4 ?C) (Oral)   Resp 17   SpO2 100%  ? ?Visual  Acuity ?Right Eye Distance:   ?Left Eye Distance:   ?Bilateral Distance:   ? ?Right Eye Near:   ?Left Eye Near:    ?Bilateral Near:    ? ?Physical Exam ?Vitals reviewed.  ?Constitutional:   ?   General: He is not in acute distress. ?   Appearance: Normal appearance. He is not ill-appearing.  ?HENT:  ?   Head: Normocephalic and atraumatic.  ?Pulmonary:  ?   Effort: Pulmonary effort is normal.  ?Musculoskeletal:  ?   Comments: R hip - no skin changes or tenderness to palpation. No inguinal hernia palpated. Pain elicited with flexion R hip and ambulation. Strength and sensation grossly intact. Gait intact but with pain. No midline spinous tenderness, deformity, stepoff.   ?Neurological:  ?   General: No focal deficit present.  ?   Mental Status: He is alert and oriented to person, place, and time.  ?Psychiatric:     ?   Mood and Affect: Mood normal.     ?   Behavior: Behavior normal.     ?   Thought Content: Thought content normal.     ?   Judgment: Judgment normal.  ? ? ? ?UC Treatments / Results  ?Labs ?(all labs ordered are listed, but only abnormal results are displayed) ?Labs Reviewed - No data to display ? ?EKG ? ? ?Radiology ?DG Hip Unilat With Pelvis 2-3 Views Right ? ?Result Date: 08/30/2021 ?CLINICAL DATA:  History of right hip pain radiating down to the knee since 3 weeks EXAM: DG HIP (WITH OR WITHOUT PELVIS) 2-3V RIGHT COMPARISON:  None Available. FINDINGS: There is no evidence of hip fracture or dislocation. Mild degenerative changes with mild narrowing of the bilateral hip joint space. No focal bony abnormality. Postsurgical changes with some surgical clips seen at the pubic symphysis. Moderately severe spondylosis of the visualized lumbosacral spine. IMPRESSION: No acute changes. Degenerative changes with some narrowing of the hip joint space on either side. Lumbosacral spondylosis. Electronically Signed   By: Frazier Richards M.D.   On: 08/30/2021 16:08   ? ?Procedures ?Procedures (including critical care  time) ? ?Medications Ordered in UC ?Medications - No data to display ? ?Initial Impression / Assessment and Plan / UC Course  ?I have reviewed the triage vital signs and the nursing notes. ? ?Pertinent labs &  imaging results that were available during my care of the patient were reviewed by me and considered in my medical decision making (see chart for details). ? ?  ? ?This patient is a very pleasant 88 y.o. year old male presenting with arthritis R hip x3 weeks. Afebrile, nontachy. No acute trauma. ? ?Xray R hip - No acute changes. Degenerative changes with some narrowing of the hip joint space on either side. Lumbosacral spondylosis ?  ?Short course of prednisone. Tylenol for discomfort. F/u with PCP for further management. ? ?ED return precautions discussed. Patient verbalizes understanding and agreement.  ? ?Final Clinical Impressions(s) / UC Diagnoses  ? ?Final diagnoses:  ?Right hip pain  ?Arthritis of right hip  ? ? ? ?Discharge Instructions   ? ?  ?-You have arthritis of the right hip. Please take prednisone one pill daily x3 days. You can take tylenol for additional relief ?-Follow-up with your primary care provider. They may refer you to an orthopedist.  ? ? ? ?ED Prescriptions   ? ? Medication Sig Dispense Auth. Provider  ? predniSONE (DELTASONE) 50 MG tablet Take 1 tablet (50 mg total) by mouth daily for 3 days. 3 tablet Hazel Sams, PA-C  ? ?  ? ?PDMP not reviewed this encounter. ?  ?Hazel Sams, PA-C ?08/30/21 1620 ? ?

## 2021-09-02 ENCOUNTER — Telehealth (HOSPITAL_COMMUNITY): Payer: Self-pay

## 2021-09-02 NOTE — Telephone Encounter (Signed)
Patient called requesitng x-ray CD, Cd was made and placed at the front desk.

## 2021-09-30 ENCOUNTER — Encounter: Payer: Self-pay | Admitting: Internal Medicine

## 2021-12-08 ENCOUNTER — Ambulatory Visit (INDEPENDENT_AMBULATORY_CARE_PROVIDER_SITE_OTHER): Payer: Medicare HMO | Admitting: Internal Medicine

## 2021-12-08 ENCOUNTER — Encounter: Payer: Self-pay | Admitting: Internal Medicine

## 2021-12-08 VITALS — BP 150/72 | HR 59 | Ht 71.0 in | Wt 138.6 lb

## 2021-12-08 DIAGNOSIS — Z1211 Encounter for screening for malignant neoplasm of colon: Secondary | ICD-10-CM | POA: Diagnosis not present

## 2021-12-08 NOTE — Patient Instructions (Signed)
Dr Carlean Purl doesn't recommend any further GI testing.   Due to recent changes in healthcare laws, you may see the results of your imaging and laboratory studies on MyChart before your provider has had a chance to review them.  We understand that in some cases there may be results that are confusing or concerning to you. Not all laboratory results come back in the same time frame and the provider may be waiting for multiple results in order to interpret others.  Please give [place] 48 hours in order for your provider to thoroughly review all the results before contacting the office for clarification of your results.    I appreciate the opportunity to care for you. Silvano Rusk, MD, [facility]

## 2021-12-08 NOTE — Progress Notes (Signed)
   Troy Salinas 88 y.o. Mar 06, 1932 [id number]  Assessment & Plan:   Encounter Diagnosis  Name Primary?   Colon cancer screening Yes   The patient presented asking about colon cancer screening but I reviewed this with him and there is no indication to pursue that.  He has lost some weight he should follow-up with primary care on these issues I do not get any sense of GI symptoms.   Subjective:   Chief Complaint: Colon cancer screening  HPI 88 year old African-American man who presents to discuss colon testing.  He is a rambling historian and starts off saying he does not want a colonoscopy but was considering a stool test.  He has a history of prostate cancer.  He says he is living alone now and has nobody to bring him for a colonoscopy.  He does not have rectal bleeding bowel habit changes dysphagia.  He has no difficulty with eating though he says food does not taste like it used to.  There has been some weight loss.  He is still followed in urology.  There is a remote history of colonoscopy 15 years ago records not available.    No Known Allergies Current Meds  Medication Sig   latanoprost (XALATAN) 0.005 % ophthalmic solution Place 1 drop into both eyes at bedtime.   Past Medical History:  Diagnosis Date   Hyperlipidemia    Prostate cancer ([place])    Tinnitus    Past Surgical History:  Procedure Laterality Date   COLONOSCOPY  2013   HERNIA REPAIR     had 2   TONSILECTOMY, ADENOIDECTOMY, BILATERAL MYRINGOTOMY AND TUBES  1942   Social History   Social History Narrative   Fun: Plays tennis   Feels safe at home and denies abuse   family history includes Bone cancer in his sister; Cancer in his maternal grandmother; Cataracts in his mother; Colon cancer in his brother and sister; Healthy in his maternal grandfather, mother, paternal grandfather, and paternal grandmother.   Review of Systems  See HPI has some insomnia urinary leakage and joint pains. Objective:    Physical Exam BP (!) 150/72   Pulse (!) 59   Ht '5\' 11"'$  (1.803 m)   Wt 138 lb 9 oz (62.9 kg)   BMI 19.33 kg/m   Thin elderly black man in no acute distress

## 2022-01-11 ENCOUNTER — Other Ambulatory Visit: Payer: Self-pay

## 2022-01-11 ENCOUNTER — Ambulatory Visit (HOSPITAL_COMMUNITY)
Admission: EM | Admit: 2022-01-11 | Discharge: 2022-01-11 | Disposition: A | Discharge status: Home / Self Care | Payer: Medicare HMO

## 2022-01-11 ENCOUNTER — Encounter (HOSPITAL_COMMUNITY): Payer: Self-pay | Admitting: *Deleted

## 2022-01-11 DIAGNOSIS — R6889 Other general symptoms and signs: Secondary | ICD-10-CM

## 2022-01-11 DIAGNOSIS — R21 Rash and other nonspecific skin eruption: Secondary | ICD-10-CM | POA: Diagnosis not present

## 2022-01-11 NOTE — ED Provider Notes (Signed)
[place]    CSN: [id number] Arrival date & time: 01/11/22  1159      History   Chief Complaint Chief Complaint  Patient presents with   Rash   Cough   increased mucous    HPI Troy Salinas is a 88 y.o. male.   Patient presents with intermittent rash to the left lateral neck and posterior neck for 1 to 2 weeks.  Rash is described as bumpy and unable to determine further presentation is that he has had difficulty visualizing due to placement.  Rash is mildly pruritic.  Improves with use of an over-the-counter cream, unknown name.  Denies drainage, fever, chills, pain.   Patient concerned with mucus in the throat that occurs intermittently.  Mucus feels as if it is sitting and is described as thick, has to forcefully cough to try to expel.  Denies sore throat, difficulty swallowing, difficulty eating, fever, chills or URI symptoms.  No known sick contacts.,  Unsure of timeline of occurrence.   Past Medical History:  Diagnosis Date   Hyperlipidemia    Prostate cancer [facility])    Tinnitus     Patient Active Problem List   Diagnosis Date Noted   Glaucoma 12/08/2017   Skin macule 11/26/2014   Cataracts, bilateral 11/26/2014   Junctional rhythm 09/05/2014   ADENOCARCINOMA, PROSTATE 10/23/2009   HYPERTROPHY PROSTATE W/O UR OBST & OTH LUTS 05/25/2009    Past Surgical History:  Procedure Laterality Date   COLONOSCOPY  2013   HERNIA REPAIR     had 2   TONSILECTOMY, ADENOIDECTOMY, BILATERAL MYRINGOTOMY AND TUBES  1942       Home Medications    Prior to Admission medications   Medication Sig Start Date End Date Taking? Authorizing Provider  acetaminophen (TYLENOL) 500 MG tablet Take 1 tablet (500 mg total) by mouth every 6 (six) hours as needed. Patient not taking: Reported on 12/08/2021 01/04/19   Loura Halt A, NP  betamethasone dipropionate (DIPROLENE) 0.05 % ointment Apply topically 2 (two) times daily. Patient not taking: Reported on 12/08/2021 06/28/21    Marney Setting, NP  clotrimazole-betamethasone (LOTRISONE) cream Apply to affected area 2 times daily prn Patient not taking: Reported on 12/08/2021 03/27/18   Raylene Everts, MD  latanoprost (XALATAN) 0.005 % ophthalmic solution Place 1 drop into both eyes at bedtime. 11/15/18   [provider]  Multiple Vitamin (MULTI-VITAMIN) tablet Take by mouth. Patient not taking: Reported on 12/08/2021    [provider]  mupirocin cream (BACTROBAN) 2 % Apply 1 application topically 2 (two) times daily. Patient not taking: Reported on 12/08/2021    [provider]  nystatin cream (MYCOSTATIN) Apply 1 application topically 2 (two) times daily. Patient not taking: Reported on 12/08/2021    [provider]  OVER THE COUNTER MEDICATION A and C pill Fish oil Latanoprost eye drops clotrimazole Patient not taking: Reported on 12/08/2021    [provider]  tiZANidine (ZANAFLEX) 4 MG tablet Take 1 tablet (4 mg total) by mouth every 6 (six) hours as needed for muscle spasms. Patient not taking: Reported on 12/08/2021 01/04/19   Orvan July, NP    Family History Family History  Problem Relation Age of Onset   Healthy Mother    Cataracts Mother    Bone cancer Sister    Colon cancer Sister    Colon cancer Brother    Cancer Maternal Grandmother    Healthy Maternal Grandfather    Healthy Paternal Grandmother  Healthy Paternal Grandfather     Social History Social History   Tobacco Use   Smoking status: Former   Smokeless tobacco: Never  Substance Use Topics   Alcohol use: Yes    Comment: rarely   Drug use: No     Allergies   Patient has no known allergies.   Review of Systems Review of Systems Defer to HPI   Physical Exam Triage Vital Signs ED Triage Vitals  Enc Vitals Group     BP 01/11/22 1317 135/77     Pulse Rate 01/11/22 1317 (!) 49     Resp 01/11/22 1317 18     Temp 01/11/22 1317 98.2 F (36.8 C)     Temp src --       SpO2 01/11/22 1317 100 %     Weight --      Height --      Head Circumference --      Peak Flow --      Pain Score 01/11/22 1315 0     Pain Loc --      Pain Edu? --      Excl. in [place]? --    No data found.  Updated Vital Signs BP 135/77   Pulse (!) 49   Temp 98.2 F (36.8 C)   Resp 18   SpO2 100%   Visual Acuity Right Eye Distance:   Left Eye Distance:   Bilateral Distance:    Right Eye Near:   Left Eye Near:    Bilateral Near:     Physical Exam Constitutional:      Appearance: Normal appearance.  HENT:     Head: Normocephalic.     Mouth/Throat:     Mouth: Mucous membranes are moist.     Pharynx: Oropharynx is clear. No posterior oropharyngeal erythema.  Eyes:     Extraocular Movements: Extraocular movements intact.  Cardiovascular:     Rate and Rhythm: Normal rate and regular rhythm.     Pulses: Normal pulses.     Heart sounds: Normal heart sounds.  Pulmonary:     Effort: Pulmonary effort is normal.     Breath sounds: Normal breath sounds.  Skin:    Comments: Mild flesh tone papular rash present to the posterior neck, unable to visualize to the lateral neck, nonerythematous, nontender, nondraining  Neurological:     Mental Status: He is alert.      UC Treatments / Results  Labs (all labs ordered are listed, but only abnormal results are displayed) Labs Reviewed - No data to display  EKG   Radiology No results found.  Procedures Procedures (including critical care time)  Medications Ordered in UC Medications - No data to display  Initial Impression / Assessment and Plan / UC Course  I have reviewed the triage vital signs and the nursing notes.  Pertinent labs & imaging results that were available during my care of the patient were reviewed by me and considered in my medical decision making (see chart for details).  Rash, throat congestion  Rash appears to be a form of contact dermatitis inflammatory in process, no current signs of infection,  discussed with patient, if over-the-counter medication has been deemed helpful may continue use with follow-up for reevaluation as needed  Pharynx is clear without obstruction, there is no erythema const well exudate or tonsillar adenopathy present on exam, lungs are clear to auscultation and O2 saturations at 100% on room air, recommended to monitor mucus and may use over-the-counter Robitussin  if it becomes worrisome, may follow-up for revaluation as needed as  Final Clinical Impressions(s) / UC Diagnoses   Final diagnoses:  None   Discharge Instructions   None    ED Prescriptions   None    PDMP not reviewed this encounter.   Hans Eden, NP 01/11/22 1357

## 2022-01-11 NOTE — ED Triage Notes (Signed)
Pt reports he had a rash on Lt side of neck and back of neck. Pt reports he has itching at rash sites. Pt also has increased mucous and has to cough to get the mucous. Pt denies sore throat.

## 2022-01-11 NOTE — Discharge Instructions (Signed)
The rash to your neck is a little bumpy but it is the color of your skin, there is no redness, swelling or drainage that would indicate infection and your skin is not dry, flaky or peeling  Your rash is most likely a result of a mild skin irritation due to something that is coming into contact with, this will come and go as the skin is is exposed to the offending agent, at this time we do not know that he is  You may continue to use the cream that you have at home as it has been helpful   The mucus in your throat is most likely a result of some form of irritation or whether that be from drainage to your nose or your drops and this is your body's way of protecting you  If the mucus in your throat becomes worrisome you may purchase over-the-counter Robitussin which will help to thin the mucus out and make it easier to drain or cough  If you have additional concerns regarding urine mucus she may follow-up with urgent care or your primary doctor for reevaluation as needed  And you may follow-up for reevaluation of your rash as needed

## 2022-02-23 ENCOUNTER — Encounter (HOSPITAL_COMMUNITY): Payer: Self-pay

## 2022-02-23 ENCOUNTER — Ambulatory Visit (HOSPITAL_COMMUNITY)
Admission: EM | Admit: 2022-02-23 | Discharge: 2022-02-23 | Disposition: A | Discharge status: Home / Self Care | Payer: Medicare HMO | Attending: Physician Assistant | Admitting: Physician Assistant

## 2022-02-23 DIAGNOSIS — J329 Chronic sinusitis, unspecified: Secondary | ICD-10-CM | POA: Diagnosis not present

## 2022-02-23 DIAGNOSIS — J40 Bronchitis, not specified as acute or chronic: Secondary | ICD-10-CM

## 2022-02-23 MED ORDER — FLUTICASONE PROPIONATE 50 MCG/ACT NA SUSP: 1.0000 | Freq: Every day | NASAL | 0 refills | Status: DC

## 2022-02-23 MED ORDER — DOXYCYCLINE HYCLATE 100 MG PO CAPS: 100.0000 mg | ORAL_CAPSULE | Freq: Two times a day (BID) | ORAL | 0 refills | Status: DC

## 2022-02-23 NOTE — ED Triage Notes (Signed)
Pt c/o cough, sore throat, and runny nose x3wks. Denies taking meds for sx's.

## 2022-02-23 NOTE — ED Provider Notes (Signed)
[name]    CSN: [id number] Arrival date & time: 02/23/22  1404      History   Chief Complaint Chief Complaint  Patient presents with   Cough   Sore Throat    HPI Troy Salinas is a 88 y.o. male.   Patient presents today with a 3-week history of URI symptoms.  Reports sore throat, cough, nasal congestion.  Denies any fever, nausea, vomiting, chest pain, shortness of breath.  He was seen by different provider several weeks ago and reports that x-rays were obtained that were negative.  He has continued to have symptoms prompting evaluation today.  He is not taking any over-the-counter medication for symptom management.  Denies any recent antibiotics or steroids.  He denies history of allergies, asthma, COPD, smoking; former smoker but quit several years ago.  Does report that he is exposed to significant dust in his house but denies formal diagnosis of allergies and has never taken medication for this condition.    Past Medical History:  Diagnosis Date   Hyperlipidemia    Prostate cancer [facility])    Tinnitus     Patient Active Problem List   Diagnosis Date Noted   Glaucoma 12/08/2017   Skin macule 11/26/2014   Cataracts, bilateral 11/26/2014   Junctional rhythm 09/05/2014   ADENOCARCINOMA, PROSTATE 10/23/2009   HYPERTROPHY PROSTATE W/O UR OBST & OTH LUTS 05/25/2009    Past Surgical History:  Procedure Laterality Date   COLONOSCOPY  2013   HERNIA REPAIR     had 2   TONSILECTOMY, ADENOIDECTOMY, BILATERAL MYRINGOTOMY AND TUBES  1942       Home Medications    Prior to Admission medications   Medication Sig Start Date End Date Taking? Authorizing Provider  doxycycline (VIBRAMYCIN) 100 MG capsule Take 1 capsule (100 mg total) by mouth 2 (two) times daily. 02/23/22  Yes Alexandria Shiflett K, PA-C  fluticasone (FLONASE) 50 MCG/ACT nasal spray Place 1 spray into both nostrils daily. 02/23/22  Yes Gwyn Mehring, Derry Skill, PA-C  acetaminophen (TYLENOL) 500 MG tablet Take 1 tablet  (500 mg total) by mouth every 6 (six) hours as needed. Patient not taking: Reported on 12/08/2021 01/04/19   Loura Halt A, NP  betamethasone dipropionate (DIPROLENE) 0.05 % ointment Apply topically 2 (two) times daily. Patient not taking: Reported on 12/08/2021 06/28/21   Marney Setting, NP  clotrimazole-betamethasone (LOTRISONE) cream Apply to affected area 2 times daily prn Patient not taking: Reported on 12/08/2021 03/27/18   Raylene Everts, MD  latanoprost (XALATAN) 0.005 % ophthalmic solution Place 1 drop into both eyes at bedtime. 11/15/18   [provider]  Multiple Vitamin (MULTI-VITAMIN) tablet Take by mouth. Patient not taking: Reported on 12/08/2021    [provider]  mupirocin cream (BACTROBAN) 2 % Apply 1 application topically 2 (two) times daily. Patient not taking: Reported on 12/08/2021    [provider]  nystatin cream (MYCOSTATIN) Apply 1 application topically 2 (two) times daily. Patient not taking: Reported on 12/08/2021    [provider]  OVER THE COUNTER MEDICATION A and C pill Fish oil Latanoprost eye drops clotrimazole Patient not taking: Reported on 12/08/2021    [provider]  tiZANidine (ZANAFLEX) 4 MG tablet Take 1 tablet (4 mg total) by mouth every 6 (six) hours as needed for muscle spasms. Patient not taking: Reported on 12/08/2021 01/04/19   Orvan July, NP    Family History Family History  Problem Relation Age of Onset  Healthy Mother    Cataracts Mother    Bone cancer Sister    Colon cancer Sister    Colon cancer Brother    Cancer Maternal Grandmother    Healthy Maternal Grandfather    Healthy Paternal Grandmother    Healthy Paternal Grandfather     Social History Social History   Tobacco Use   Smoking status: Former   Smokeless tobacco: Never  Substance Use Topics   Alcohol use: Yes    Comment: rarely   Drug use: No     Allergies   Patient has no known allergies.   Review of  Systems Review of Systems  Constitutional:  Positive for activity change. Negative for appetite change, fatigue and fever.  HENT:  Positive for congestion, postnasal drip and sore throat. Negative for sneezing.   Respiratory:  Positive for cough. Negative for shortness of breath.   Cardiovascular:  Negative for chest pain.  Gastrointestinal:  Negative for abdominal pain, diarrhea, nausea and vomiting.  Neurological:  Negative for dizziness, light-headedness and headaches.     Physical Exam Triage Vital Signs ED Triage Vitals [02/23/22 1415]  Enc Vitals Group     BP (!) 162/79     Pulse Rate (!) 52     Resp 18     Temp 97.7 F (36.5 C)     Temp Source Oral     SpO2 98 %     Weight      Height      Head Circumference      Peak Flow      Pain Score 0     Pain Loc      Pain Edu?      Excl. in [place]?    No data found.  Updated Vital Signs BP (!) 162/79 (BP Location: Left Arm)   Pulse (!) 52   Temp 97.7 F (36.5 C) (Oral)   Resp 18   SpO2 98%   Visual Acuity Right Eye Distance:   Left Eye Distance:   Bilateral Distance:    Right Eye Near:   Left Eye Near:    Bilateral Near:     Physical Exam Vitals reviewed.  Constitutional:      General: He is awake.     Appearance: Normal appearance. He is well-developed. He is not ill-appearing.     Comments: Very pleasant male appears stated age in no acute distress sitting comfortably in exam room  HENT:     Head: Normocephalic and atraumatic.     Right Ear: Ear canal and external ear normal. There is impacted cerumen. Tympanic membrane is not erythematous or bulging.     Left Ear: Tympanic membrane, ear canal and external ear normal. Tympanic membrane is not erythematous or bulging.     Nose:     Right Sinus: Maxillary sinus tenderness present. No frontal sinus tenderness.     Left Sinus: Maxillary sinus tenderness present. No frontal sinus tenderness.     Mouth/Throat:     Pharynx: Uvula midline. Posterior oropharyngeal  erythema present. No oropharyngeal exudate.     Comments: Erythema and drainage in posterior oropharynx Cardiovascular:     Rate and Rhythm: Normal rate and regular rhythm.     Heart sounds: Normal heart sounds, S1 normal and S2 normal. No murmur heard. Pulmonary:     Effort: Pulmonary effort is normal. No accessory muscle usage or respiratory distress.     Breath sounds: Normal breath sounds. No stridor. No wheezing, rhonchi or rales.  Comments: Clear to auscultation bilaterally Abdominal:     General: Bowel sounds are normal.     Palpations: Abdomen is soft.     Tenderness: There is no abdominal tenderness.  Neurological:     Mental Status: He is alert.  Psychiatric:        Behavior: Behavior is cooperative.      UC Treatments / Results  Labs (all labs ordered are listed, but only abnormal results are displayed) Labs Reviewed - No data to display  EKG   Radiology No results found.  Procedures Procedures (including critical care time)  Medications Ordered in UC Medications - No data to display  Initial Impression / Assessment and Plan / UC Course  I have reviewed the triage vital signs and the nursing notes.  Pertinent labs & imaging results that were available during my care of the patient were reviewed by me and considered in my medical decision making (see chart for details).     Patient is well-appearing, afebrile, nontoxic, nontachycardic.  He is mildly bradycardic with this is his baseline.  No indication for viral testing as patient has been symptomatic for several weeks and this would not change management.  Did discuss potential utility of x-ray given prolonged cough and advanced age, however, patient declined this today.  I think this is reasonable to defer as he has no adventitious sounds on exam and oxygen saturation is 98%.  We will empirically treat for sinobronchitis with doxycycline 100 mg twice daily for 10 days.  We will also start allergy medication  in the form of Flonase 1 spray in each nostril daily to help manage congestion symptoms.  Recommended that he gargle with warm salt water and use over-the-counter medications.  Recommend close follow-up with primary care.  Discussed that if symptoms not improving within a week he is to return at which point we would consider x-ray.  If anything worsens he needs to go to the emergency room.  Strict return precautions given.  Final Clinical Impressions(s) / UC Diagnoses   Final diagnoses:  Sinobronchitis     Discharge Instructions      Take doxycycline 100 mg twice daily for 10 days to cover for infection.  Start Flonase daily.  Use a humidifier to help with your cough.  I also recommend gargle with warm salt water.  If your symptoms are not improving within a week please return for reevaluation.  If at any point anything worsens and you have worsening cough, shortness of breath, chest pain, fever, nausea, vomiting, weakness you need to be seen immediately.     ED Prescriptions     Medication Sig Dispense Auth. Provider   fluticasone (FLONASE) 50 MCG/ACT nasal spray Place 1 spray into both nostrils daily. 16 g Jo-Anne Kluth K, PA-C   doxycycline (VIBRAMYCIN) 100 MG capsule Take 1 capsule (100 mg total) by mouth 2 (two) times daily. 20 capsule Aileana Hodder, Derry Skill, PA-C      PDMP not reviewed this encounter.   Terrilee Croak, PA-C 02/23/22 1445

## 2022-02-23 NOTE — Discharge Instructions (Signed)
Take doxycycline 100 mg twice daily for 10 days to cover for infection.  Start Flonase daily.  Use a humidifier to help with your cough.  I also recommend gargle with warm salt water.  If your symptoms are not improving within a week please return for reevaluation.  If at any point anything worsens and you have worsening cough, shortness of breath, chest pain, fever, nausea, vomiting, weakness you need to be seen immediately.

## 2022-08-11 ENCOUNTER — Encounter (HOSPITAL_COMMUNITY): Payer: Self-pay | Admitting: *Deleted

## 2022-08-11 ENCOUNTER — Ambulatory Visit (HOSPITAL_COMMUNITY)
Admission: EM | Admit: 2022-08-11 | Discharge: 2022-08-11 | Disposition: A | Discharge status: Home / Self Care | Payer: Medicare HMO | Attending: Sports Medicine | Admitting: Sports Medicine

## 2022-08-11 DIAGNOSIS — R030 Elevated blood-pressure reading, without diagnosis of hypertension: Secondary | ICD-10-CM | POA: Diagnosis not present

## 2022-08-11 DIAGNOSIS — H6191 Disorder of right external ear, unspecified: Secondary | ICD-10-CM

## 2022-08-11 DIAGNOSIS — L723 Sebaceous cyst: Secondary | ICD-10-CM | POA: Diagnosis not present

## 2022-08-11 NOTE — ED Provider Notes (Signed)
[facility]    CSN: [id number] Arrival date & time: 08/11/22  4098      History   Chief Complaint Chief Complaint  Patient presents with   Cyst    HPI Troy Salinas is a 89 y.o. male.   He is here today with chief complaint of some bumps on his scalp and a dark spot on his temple.  He reports he has seen dermatology for the bumps in his hairline in the past and was told to wash the area more frequently.  The dark spot on his left temple he reports he picks at frequently he is unsure on if it has changed in size or appearance.  He has not yet returned to dermatology for this lesion.  He denies any unintentional weight loss, chest pain, night sweats fevers or chills.  He reports he thinks his blood pressure is up as he gets anxious and excited when he is at the doctor's office.  He has not previously been told he has a diagnosis of hypertension.     Past Medical History:  Diagnosis Date   Hyperlipidemia    Prostate cancer    Tinnitus     Patient Active Problem List   Diagnosis Date Noted   Glaucoma 12/08/2017   Skin macule 11/26/2014   Cataracts, bilateral 11/26/2014   Junctional rhythm 09/05/2014   ADENOCARCINOMA, PROSTATE 10/23/2009   HYPERTROPHY PROSTATE W/O UR OBST & OTH LUTS 05/25/2009    Past Surgical History:  Procedure Laterality Date   COLONOSCOPY  2013   HERNIA REPAIR     had 2   TONSILECTOMY, ADENOIDECTOMY, BILATERAL MYRINGOTOMY AND TUBES  1942       Home Medications    Prior to Admission medications   Medication Sig Start Date End Date Taking? Authorizing Provider  dorzolamide-timolol (COSOPT) 2-0.5 % ophthalmic solution  09/23/19  Yes [provider]  acetaminophen (TYLENOL) 500 MG tablet Take 1 tablet (500 mg total) by mouth every 6 (six) hours as needed. Patient not taking: Reported on 12/08/2021 01/04/19   Dahlia Byes A, NP  betamethasone dipropionate (DIPROLENE) 0.05 % ointment Apply topically 2 (two) times daily. Patient not  taking: Reported on 12/08/2021 06/28/21   Coralyn Mark, NP  clotrimazole-betamethasone (LOTRISONE) cream Apply to affected area 2 times daily prn Patient not taking: Reported on 12/08/2021 03/27/18   Eustace Moore, MD  doxycycline (VIBRAMYCIN) 100 MG capsule Take 1 capsule (100 mg total) by mouth 2 (two) times daily. 02/23/22   Raspet, Denny Peon K, PA-C  fluticasone (FLONASE) 50 MCG/ACT nasal spray Place 1 spray into both nostrils daily. 02/23/22   Raspet, Noberto Retort, PA-C  latanoprost (XALATAN) 0.005 % ophthalmic solution Place 1 drop into both eyes at bedtime. 11/15/18   [provider]  Multiple Vitamin (MULTI-VITAMIN) tablet Take by mouth. Patient not taking: Reported on 12/08/2021    [provider]  mupirocin cream (BACTROBAN) 2 % Apply 1 application topically 2 (two) times daily. Patient not taking: Reported on 12/08/2021    [provider]  nystatin cream (MYCOSTATIN) Apply 1 application topically 2 (two) times daily. Patient not taking: Reported on 12/08/2021    [provider]  OVER THE COUNTER MEDICATION A and C pill Fish oil Latanoprost eye drops clotrimazole Patient not taking: Reported on 12/08/2021    [provider]  tiZANidine (ZANAFLEX) 4 MG tablet Take 1 tablet (4 mg total) by mouth every 6 (six) hours as needed for muscle spasms. Patient not taking: Reported  on 12/08/2021 01/04/19   Janace Aris, NP    Family History Family History  Problem Relation Age of Onset   Healthy Mother    Cataracts Mother    Bone cancer Sister    Colon cancer Sister    Colon cancer Brother    Cancer Maternal Grandmother    Healthy Maternal Grandfather    Healthy Paternal Grandmother    Healthy Paternal Grandfather     Social History Social History   Tobacco Use   Smoking status: Former   Smokeless tobacco: Never  Building services engineer Use: Never used  Substance Use Topics   Alcohol use: Yes    Comment: rarely   Drug use: Never      Allergies   Patient has no known allergies.   Review of Systems Review of Systems as listed above in HPI   Physical Exam Triage Vital Signs ED Triage Vitals  Enc Vitals Group     BP 08/11/22 1006 (!) 197/92     Pulse Rate 08/11/22 1006 71     Resp 08/11/22 1006 18     Temp 08/11/22 1006 97.6 F (36.4 C)     Temp Source 08/11/22 1006 Oral     SpO2 08/11/22 1006 97 %     Weight --      Height --      Head Circumference --      Peak Flow --      Pain Score 08/11/22 1004 0     Pain Loc --      Pain Edu? --      Excl. in [place]? --    No data found.  Updated Vital Signs BP (!) 173/93 (BP Location: Right Arm)   Pulse 64   Temp 97.6 F (36.4 C) (Oral)   Resp 18   SpO2 96%   Physical Exam Vitals reviewed.  Constitutional:      General: He is not in acute distress.    Appearance: Normal appearance. He is normal weight. He is not ill-appearing or toxic-appearing.  HENT:     Head: Normocephalic.     Ears:     Comments: He has a pigmented nodule on the inside of his right ear    Nose: Nose normal.  Cardiovascular:     Rate and Rhythm: Normal rate.     Pulses: Normal pulses.     Heart sounds: Normal heart sounds. No murmur heard. Pulmonary:     Effort: Pulmonary effort is normal.  Skin:    General: Skin is warm.     Comments: Hyperpigmented scaled plaque on his left temple, well-circumscribed   Raised lesions on his scalp along the line of where he would wear a baseball cap, no drainage from the areas today.  No streaking, tenderness to palpation  Neurological:     Mental Status: He is alert.      UC Treatments / Results  Labs (all labs ordered are listed, but only abnormal results are displayed) Labs Reviewed - No data to display  EKG   Radiology No results found.  Procedures Procedures (including critical care time)  Medications Ordered in UC Medications - No data to display  Initial Impression / Assessment and Plan / UC Course  I have  reviewed the triage vital signs and the nursing notes.  Pertinent labs & imaging results that were available during my care of the patient were reviewed by me and considered in my medical decision making (see chart for  details).     Small sebaceous cyst in hairline Discussed with patient that these are benign I would recommend he continue with appropriate hygiene however they do not require drainage.  They do not appear infected today.  Pigmented lesion, likely seborrheic keratosis Recommend follow-up with dermatology for further evaluation  Pigmented nodule in right ear Recommend follow-up with dermatology for further evaluation  Elevated blood pressure Discussed with patient that his blood pressure was quite elevated today and remained above normal readings on repeat.  I recommend he see his primary care provider within the next 5 to 7 days to discuss his blood pressure and hopeful if they can help him see dermatology.  I counseled him on the importance of blood pressure management.  Should he have any symptoms I recommend and he be evaluated at the urgent care or ER.  He verbalized understanding Final Clinical Impressions(s) / UC Diagnoses   Final diagnoses:  Sebaceous cyst  Elevated blood pressure reading  Skin lesion of right ear     Discharge Instructions      The small lumps on her head appear to be benign.  The dark spot on your temple I recommend having evaluated by dermatology as well as the red raised lesion on the inside of your right ear.  I have provided dermatology information for the physician assistant that you previously saw as well as a [facility] dermatology location.  Your blood pressure was elevated again today on your repeat check.  You need to speak to your primary care provider about this and discuss medication management.  I recommend you have an appoint with them next week.     ED Prescriptions   None    PDMP not reviewed this encounter.   Gillermo Murdoch A, DO 08/11/22 1050

## 2022-08-11 NOTE — ED Triage Notes (Signed)
Pt states he has some knots on the left side of the head at the stop which he has had on and off for while. He states he has had a dark spot on his left temple he noticed 3 weeks ago. He states he has seen derm but they didn't help him at all so he didn't go back.

## 2022-08-11 NOTE — Discharge Instructions (Signed)
The small lumps on her head appear to be benign.  The dark spot on your temple I recommend having evaluated by dermatology as well as the red raised lesion on the inside of your right ear.  I have provided dermatology information for the physician assistant that you previously saw as well as a [facility] dermatology location.  Your blood pressure was elevated again today on your repeat check.  You need to speak to your primary care provider about this and discuss medication management.  I recommend you have an appoint with them next week.

## 2022-10-03 ENCOUNTER — Ambulatory Visit (HOSPITAL_COMMUNITY)
Admission: EM | Admit: 2022-10-03 | Discharge: 2022-10-03 | Disposition: A | Discharge status: Home / Self Care | Payer: Medicare HMO

## 2022-10-03 ENCOUNTER — Encounter (HOSPITAL_COMMUNITY): Payer: Self-pay

## 2022-10-03 DIAGNOSIS — R600 Localized edema: Secondary | ICD-10-CM | POA: Diagnosis not present

## 2022-10-03 DIAGNOSIS — M25579 Pain in unspecified ankle and joints of unspecified foot: Secondary | ICD-10-CM | POA: Diagnosis not present

## 2022-10-03 NOTE — ED Provider Notes (Signed)
[facility]    CSN: [id number] Arrival date & time: 10/03/22  1006      History   Chief Complaint Chief Complaint  Patient presents with   Joint Swelling    Right foot    HPI Troy Salinas is a 89 y.o. male.  Patient reports he noticed his right foot swollen about 3 days ago.  Has not paid attention to whether or not the swelling is intermittent (such as if it has decreased in the morning).  It is not painful, it is not bothersome, he just is worried about his circulation given his age.  HPI  Past Medical History:  Diagnosis Date   Hyperlipidemia    Prostate cancer [facility])    Tinnitus     Patient Active Problem List   Diagnosis Date Noted   Glaucoma 12/08/2017   Skin macule 11/26/2014   Cataracts, bilateral 11/26/2014   Junctional rhythm 09/05/2014   ADENOCARCINOMA, PROSTATE 10/23/2009   HYPERTROPHY PROSTATE W/O UR OBST & OTH LUTS 05/25/2009    Past Surgical History:  Procedure Laterality Date   COLONOSCOPY  2013   HERNIA REPAIR     had 2   TONSILECTOMY, ADENOIDECTOMY, BILATERAL MYRINGOTOMY AND TUBES  1942       Home Medications    Prior to Admission medications   Medication Sig Start Date End Date Taking? Authorizing Provider  dorzolamide-timolol (COSOPT) 2-0.5 % ophthalmic solution  09/23/19  Yes [provider]  latanoprost (XALATAN) 0.005 % ophthalmic solution Place 1 drop into both eyes at bedtime. 11/15/18  Yes [provider]  acetaminophen (TYLENOL) 500 MG tablet Take 1 tablet (500 mg total) by mouth every 6 (six) hours as needed. Patient not taking: Reported on 12/08/2021 01/04/19   Dahlia Byes A, NP  betamethasone dipropionate (DIPROLENE) 0.05 % ointment Apply topically 2 (two) times daily. Patient not taking: Reported on 12/08/2021 06/28/21   Coralyn Mark, NP  clotrimazole-betamethasone (LOTRISONE) cream Apply to affected area 2 times daily prn Patient not taking: Reported on 12/08/2021 03/27/18   Eustace Moore, MD   doxycycline (VIBRAMYCIN) 100 MG capsule Take 1 capsule (100 mg total) by mouth 2 (two) times daily. 02/23/22   Raspet, Denny Peon K, PA-C  fluticasone (FLONASE) 50 MCG/ACT nasal spray Place 1 spray into both nostrils daily. 02/23/22   Raspet, Noberto Retort, PA-C  Multiple Vitamin (MULTI-VITAMIN) tablet Take by mouth. Patient not taking: Reported on 12/08/2021    [provider]  mupirocin cream (BACTROBAN) 2 % Apply 1 application topically 2 (two) times daily. Patient not taking: Reported on 12/08/2021    [provider]  nystatin cream (MYCOSTATIN) Apply 1 application topically 2 (two) times daily. Patient not taking: Reported on 12/08/2021    [provider]  OVER THE COUNTER MEDICATION A and C pill Fish oil Latanoprost eye drops clotrimazole Patient not taking: Reported on 12/08/2021    [provider]  tiZANidine (ZANAFLEX) 4 MG tablet Take 1 tablet (4 mg total) by mouth every 6 (six) hours as needed for muscle spasms. Patient not taking: Reported on 12/08/2021 01/04/19   Janace Aris, NP    Family History Family History  Problem Relation Age of Onset   Healthy Mother    Cataracts Mother    Bone cancer Sister    Colon cancer Sister    Colon cancer Brother    Cancer Maternal Grandmother    Healthy Maternal Grandfather    Healthy Paternal Grandmother    Healthy Paternal Grandfather  Social History Social History   Tobacco Use   Smoking status: Former   Smokeless tobacco: Never  Building services engineer Use: Never used  Substance Use Topics   Alcohol use: Yes    Comment: rarely   Drug use: Never     Allergies   Patient has no known allergies.   Review of Systems Review of Systems   Physical Exam Triage Vital Signs ED Triage Vitals  Enc Vitals Group     BP 10/03/22 1124 (!) 187/68     Pulse Rate 10/03/22 1124 (!) 48     Resp 10/03/22 1124 16     Temp 10/03/22 1124 97.6 F (36.4 C)     Temp Source 10/03/22 1124 Oral     SpO2 10/03/22 1124  98 %     Weight 10/03/22 1123 160 lb (72.6 kg)     Height 10/03/22 1123 5\' 10"  (1.778 m)     Head Circumference --      Peak Flow --      Pain Score 10/03/22 1123 0     Pain Loc --      Pain Edu? --      Excl. in GC? --    No data found.  Updated Vital Signs BP (!) 187/68 (BP Location: Left Arm)   Pulse (!) 48   Temp 97.6 F (36.4 C) (Oral)   Resp 16   Ht 5\' 10"  (1.778 m)   Wt 160 lb (72.6 kg)   SpO2 98%   BMI 22.96 kg/m   Visual Acuity Right Eye Distance:   Left Eye Distance:   Bilateral Distance:    Right Eye Near:   Left Eye Near:    Bilateral Near:     Physical Exam Constitutional:      General: He is not in acute distress.    Appearance: Normal appearance. He is not ill-appearing.  Cardiovascular:     Pulses:          Dorsalis pedis pulses are 2+ on the right side and 2+ on the left side.  Pulmonary:     Effort: Pulmonary effort is normal.  Musculoskeletal:     Right foot: Swelling present.     Comments: Mild nonpitting edema of the right foot.  No pain with palpation of the right  Feet:     Right foot:     Skin integrity: Skin integrity normal. No erythema.     Left foot:     Skin integrity: Skin integrity normal.  Neurological:     Mental Status: He is alert.      UC Treatments / Results  Labs (all labs ordered are listed, but only abnormal results are displayed) Labs Reviewed - No data to display  EKG   Radiology No results found.  Procedures Procedures (including critical care time)  Medications Ordered in UC Medications - No data to display  Initial Impression / Assessment and Plan / UC Course  I have reviewed the triage vital signs and the nursing notes.  Pertinent labs & imaging results that were available during my care of the patient were reviewed by me and considered in my medical decision making (see chart for details).    Discussed wearing compression socks and supportive shoes.  Recommended follow-up with podiatrist for  general footcare.  Asked patient to monitor if there is still swelling after elevating his feet.  Patient does not have a PCP.  Discussed needing 1 and half to find 1 at  [place].  Final Clinical Impressions(s) / UC Diagnoses   Final diagnoses:  Pain in joint involving ankle and foot, unspecified laterality  Peripheral edema     Discharge Instructions      If you do not have a primary care provider, and you want to find one at [facility], go to [email or web address], click on "primary care," click on "new patients: schedule online," choose internal medicine or family medicine, and choose an appointment place and time that fits your schedule.   Wear your support socks and supportive shoes.  Follow-up with the foot doctor (podiatrist).  See contact information below.  I suggest being attention to whether or not your feet are swollen all day long at all times, or if it is less swollen in the morning after resting with your feet up overnight.  This will be helpful to share with your new primary care provider to figure out the cause of the swelling.    ED Prescriptions   None    PDMP not reviewed this encounter.   Cathlyn Parsons, NP 10/03/22 1308

## 2022-10-03 NOTE — Discharge Instructions (Signed)
If you do not have a primary care provider, and you want to find one at [facility], go to [email or web address], click on "primary care," click on "new patients: schedule online," choose internal medicine or family medicine, and choose an appointment place and time that fits your schedule.   Wear your support socks and supportive shoes.  Follow-up with the foot doctor (podiatrist).  See contact information below.  I suggest being attention to whether or not your feet are swollen all day long at all times, or if it is less swollen in the morning after resting with your feet up overnight.  This will be helpful to share with your new primary care provider to figure out the cause of the swelling.

## 2022-10-03 NOTE — ED Triage Notes (Signed)
Patient here today with c/o right foot swelling and stiffness X 2 days. No numbness or tingling. No pain. No known injury.

## 2022-10-13 ENCOUNTER — Ambulatory Visit: Payer: Medicare HMO | Admitting: Podiatry

## 2022-11-03 ENCOUNTER — Encounter (HOSPITAL_COMMUNITY): Payer: Self-pay

## 2022-11-03 ENCOUNTER — Ambulatory Visit (HOSPITAL_COMMUNITY)
Admission: EM | Admit: 2022-11-03 | Discharge: 2022-11-03 | Disposition: A | Discharge status: Home / Self Care | Payer: Medicare HMO | Attending: Physician Assistant | Admitting: Physician Assistant

## 2022-11-03 DIAGNOSIS — T63441A Toxic effect of venom of bees, accidental (unintentional), initial encounter: Secondary | ICD-10-CM

## 2022-11-03 DIAGNOSIS — L509 Urticaria, unspecified: Secondary | ICD-10-CM

## 2022-11-03 DIAGNOSIS — I10 Essential (primary) hypertension: Secondary | ICD-10-CM | POA: Diagnosis not present

## 2022-11-03 MED ORDER — TRIAMCINOLONE ACETONIDE 0.1 % EX CREA: 1.0000 | TOPICAL_CREAM | Freq: Two times a day (BID) | CUTANEOUS | 0 refills | Status: AC

## 2022-11-03 MED ORDER — CETIRIZINE HCL 5 MG PO TABS: 5.0000 mg | ORAL_TABLET | Freq: Every day | ORAL | 0 refills | Status: DC

## 2022-11-03 NOTE — Discharge Instructions (Signed)
It appears you are having a localized allergic reaction.  This should resolve with time.  Keep the areas clean to prevent infection.  Take cetirizine daily to help with your itching.  You can use a cool compress on this area.  Apply triamcinolone cream as needed.  If you have any worsening symptoms including widespread rash, nausea/vomiting, shortness of breath, swelling of your throat, chest tightness you need to go to the emergency room.  Your blood pressure is very elevated.  Has been elevated the past several times.  Monitor your diet for salt.  Avoid decongestants, caffeine, sodium.  Monitor this at home and if it is persistently above 140/90 even after making lifestyle changes you need to return here or see your PCP to consider medication.  If you develop any chest pain, shortness of breath, headache, vision change, dizziness in the setting of high blood pressure you need to go to the emergency room.

## 2022-11-03 NOTE — ED Provider Notes (Signed)
[facility]    CSN: [id number] Arrival date & time: 11/03/22  1539      History   Chief Complaint Chief Complaint  Patient presents with   Insect Bite    HPI Troy Salinas is a 89 y.o. male.   Patient presents today with a several hour history of pruritic and painful lesions on his arms and scalp following being stung by bees.  Reports he was mowing the lawn when he drove over a nest of bees.  He has multiple stings on his left forearm as well as 1 on his head.  He denies any stings to his face or additional symptoms including swelling of his throat, shortness of breath, muffled voice, spread of rash/lesions.  He has not tried any over-the-counter medication for symptom management.  Denies history of allergic reactions including previous anaphylaxis.  He did monitor himself for a while and did not have any worsening symptoms.  He contemplated not coming but could not see the area on the top of his head and so wanted this evaluated.  Patient's blood pressure was noted to be elevated.  Review of previous visits indicate that he has consistently had elevated blood pressure readings.  He denies formal diagnosis of hypertension and does not take any antihypertensive medication.  He does report that when he restricts up he drinks a juice that has a lot of salt and sugar.  He has considered discontinuing this but does not make this lifestyle change yet.  He does have a primary care but has difficulty getting in with them regularly.  He denies any chest pain, shortness of breath, headache, vision change, dizziness.    Past Medical History:  Diagnosis Date   Hyperlipidemia    Prostate cancer [facility])    Tinnitus     Patient Active Problem List   Diagnosis Date Noted   Glaucoma 12/08/2017   Skin macule 11/26/2014   Cataracts, bilateral 11/26/2014   Junctional rhythm 09/05/2014   ADENOCARCINOMA, PROSTATE 10/23/2009   HYPERTROPHY PROSTATE W/O UR OBST & OTH LUTS 05/25/2009    Past  Surgical History:  Procedure Laterality Date   COLONOSCOPY  2013   HERNIA REPAIR     had 2   TONSILECTOMY, ADENOIDECTOMY, BILATERAL MYRINGOTOMY AND TUBES  1942       Home Medications    Prior to Admission medications   Medication Sig Start Date End Date Taking? Authorizing Provider  cetirizine (ZYRTEC) 5 MG tablet Take 1 tablet (5 mg total) by mouth daily. 11/03/22  Yes Jove Beyl K, PA-C  dorzolamide-timolol (COSOPT) 2-0.5 % ophthalmic solution  09/23/19  Yes [provider]  latanoprost (XALATAN) 0.005 % ophthalmic solution Place 1 drop into both eyes at bedtime. 11/15/18  Yes [provider]  triamcinolone cream (KENALOG) 0.1 % Apply 1 Application topically 2 (two) times daily. 11/03/22  Yes Amberlea Spagnuolo K, PA-C  fluticasone (FLONASE) 50 MCG/ACT nasal spray Place 1 spray into both nostrils daily. 02/23/22   Fransheska Willingham, Noberto Retort, PA-C  OVER THE COUNTER MEDICATION A and C pill Fish oil Latanoprost eye drops clotrimazole Patient not taking: Reported on 12/08/2021    [provider]    Family History Family History  Problem Relation Age of Onset   Healthy Mother    Cataracts Mother    Bone cancer Sister    Colon cancer Sister    Colon cancer Brother    Cancer Maternal Grandmother    Healthy Maternal Grandfather    Healthy Paternal Grandmother  Healthy Paternal Grandfather     Social History Social History   Tobacco Use   Smoking status: Former   Smokeless tobacco: Never  Advertising account planner   Vaping status: Never Used  Substance Use Topics   Alcohol use: Yes    Comment: rarely   Drug use: Never     Allergies   Patient has no known allergies.   Review of Systems Review of Systems  Constitutional:  Negative for activity change, appetite change, fatigue and fever.  Eyes:  Negative for visual disturbance.  Respiratory:  Negative for cough, chest tightness, shortness of breath and wheezing.   Cardiovascular:  Negative for chest pain.  Skin:   Positive for color change. Negative for wound.  Neurological:  Negative for dizziness, light-headedness and headaches.     Physical Exam Triage Vital Signs ED Triage Vitals  Encounter Vitals Group     BP 11/03/22 1647 (!) 170/75     Systolic BP Percentile --      Diastolic BP Percentile --      Pulse Rate 11/03/22 1647 (!) 53     Resp 11/03/22 1647 18     Temp 11/03/22 1647 97.8 F (36.6 C)     Temp Source 11/03/22 1647 Oral     SpO2 11/03/22 1647 98 %     Weight 11/03/22 1646 150 lb (68 kg)     Height 11/03/22 1646 5\' 10"  (1.778 m)     Head Circumference --      Peak Flow --      Pain Score 11/03/22 1645 0     Pain Loc --      Pain Education --      Exclude from Growth Chart --    No data found.  Updated Vital Signs BP (!) 170/75 (BP Location: Right Arm)   Pulse (!) 53   Temp 97.8 F (36.6 C) (Oral)   Resp 18   Ht 5\' 10"  (1.778 m)   Wt 150 lb (68 kg)   SpO2 98%   BMI 21.52 kg/m   Visual Acuity Right Eye Distance:   Left Eye Distance:   Bilateral Distance:    Right Eye Near:   Left Eye Near:    Bilateral Near:     Physical Exam Vitals reviewed.  Constitutional:      General: He is awake.     Appearance: Normal appearance. He is well-developed. He is not ill-appearing.     Comments: Very pleasant male appears stated age in no acute distress sitting comfortably in exam room  HENT:     Head: Normocephalic and atraumatic.     Mouth/Throat:     Tongue: Tongue does not deviate from midline.     Pharynx: Uvula midline. No oropharyngeal exudate, posterior oropharyngeal erythema or uvula swelling.  Cardiovascular:     Rate and Rhythm: Normal rate and regular rhythm.     Heart sounds: Normal heart sounds, S1 normal and S2 normal. No murmur heard. Pulmonary:     Effort: Pulmonary effort is normal.     Breath sounds: Normal breath sounds. No stridor. No wheezing, rhonchi or rales.     Comments: Clear to auscultation bilaterally Skin:    Findings: Erythema and  wound present.     Comments: Multiple erythematous lesions on bilateral arms and scalp ranging in size from 2 x 2 cm to 4 x 4 cm.  No streaking or evidence of lymphangitis pain or bleeding or drainage noted.  No widespread urticaria.  Neurological:  Mental Status: He is alert.  Psychiatric:        Behavior: Behavior is cooperative.      UC Treatments / Results  Labs (all labs ordered are listed, but only abnormal results are displayed) Labs Reviewed - No data to display  EKG   Radiology No results found.  Procedures Procedures (including critical care time)  Medications Ordered in UC Medications - No data to display  Initial Impression / Assessment and Plan / UC Course  I have reviewed the triage vital signs and the nursing notes.  Pertinent labs & imaging results that were available during my care of the patient were reviewed by me and considered in my medical decision making (see chart for details).     Patient is well-appearing, afebrile, nontoxic, nontachycardic.  He has mild erythema surrounding bee stings likely related to localized reaction.  No widespread hives or concern for systemic reaction.  Patient was encouraged to take cetirizine at night to help manage itching as well as use triamcinolone cream.  He is to keep area clean to prevent secondary infection.  He is already monitored himself for 8 hours without worsening symptoms we discussed that if at any point something does worsen he needs to be seen immediately.  Discussed that he should go to the emergency room with any swelling of his throat, shortness of breath, muffled voice, widespread rash.  Should return precautions given.  Blood pressure is very elevated today review of last few visits indicates this is typically elevated.  We discussed potential utility of starting a blood pressure medication but patient declined this today.  He prefers to initiate lifestyle changes and intends to decrease his salt  consumption.  He denies any symptoms or signs of endorgan damage.  He will monitor his blood pressure at home and we discussed that if after lifestyle modification he continues to have elevated blood pressure reading above 140/90 he should return here or follow-up with his primary care to consider medication.  Discussed that if he has any chest pain, shortness of breath, headache, vision change, dizziness in setting of high blood pressure he needs to go to the emergency room to which he expressed understanding.  Final Clinical Impressions(s) / UC Diagnoses   Final diagnoses:  Allergic reaction to bee sting  Urticaria  Elevated blood pressure reading with diagnosis of hypertension     Discharge Instructions      It appears you are having a localized allergic reaction.  This should resolve with time.  Keep the areas clean to prevent infection.  Take cetirizine daily to help with your itching.  You can use a cool compress on this area.  Apply triamcinolone cream as needed.  If you have any worsening symptoms including widespread rash, nausea/vomiting, shortness of breath, swelling of your throat, chest tightness you need to go to the emergency room.  Your blood pressure is very elevated.  Has been elevated the past several times.  Monitor your diet for salt.  Avoid decongestants, caffeine, sodium.  Monitor this at home and if it is persistently above 140/90 even after making lifestyle changes you need to return here or see your PCP to consider medication.  If you develop any chest pain, shortness of breath, headache, vision change, dizziness in the setting of high blood pressure you need to go to the emergency room.     ED Prescriptions     Medication Sig Dispense Auth. Provider   triamcinolone cream (KENALOG) 0.1 % Apply 1 Application  topically 2 (two) times daily. 30 g Darrie Macmillan K, PA-C   cetirizine (ZYRTEC) 5 MG tablet Take 1 tablet (5 mg total) by mouth daily. 14 tablet Myangel Summons, Noberto Retort,  PA-C      PDMP not reviewed this encounter.   Jeani Hawking, PA-C 11/03/22 1733

## 2022-11-03 NOTE — ED Triage Notes (Signed)
Bee sting around 1000 this morning. Has swelling in the head where he was stung. No allergy to bees.

## 2022-11-10 ENCOUNTER — Ambulatory Visit (INDEPENDENT_AMBULATORY_CARE_PROVIDER_SITE_OTHER): Payer: Medicare HMO | Admitting: Podiatry

## 2022-11-10 ENCOUNTER — Other Ambulatory Visit: Payer: Self-pay | Admitting: Podiatry

## 2022-11-10 ENCOUNTER — Ambulatory Visit (INDEPENDENT_AMBULATORY_CARE_PROVIDER_SITE_OTHER): Payer: Medicare HMO

## 2022-11-10 DIAGNOSIS — G609 Hereditary and idiopathic neuropathy, unspecified: Secondary | ICD-10-CM

## 2022-11-10 DIAGNOSIS — M79672 Pain in left foot: Secondary | ICD-10-CM | POA: Diagnosis not present

## 2022-11-10 DIAGNOSIS — B353 Tinea pedis: Secondary | ICD-10-CM

## 2022-11-10 DIAGNOSIS — M79671 Pain in right foot: Secondary | ICD-10-CM

## 2022-11-10 DIAGNOSIS — M19072 Primary osteoarthritis, left ankle and foot: Secondary | ICD-10-CM

## 2022-11-10 DIAGNOSIS — M19071 Primary osteoarthritis, right ankle and foot: Secondary | ICD-10-CM

## 2022-11-10 MED ORDER — KETOCONAZOLE 2 % EX CREA: 1.0000 | TOPICAL_CREAM | Freq: Two times a day (BID) | CUTANEOUS | 2 refills | Status: AC

## 2022-11-10 NOTE — Progress Notes (Signed)
  Subjective:  Patient ID: Troy Salinas, male    DOB: [birth year],  MRN: [id number]  Chief Complaint  Patient presents with   Foot Swelling    Pt states he has had right foot swelling for 2-3 weeks on top of the foot and his feet have been sore     89 y.o. male presents with the above complaint. History confirmed with patient.   Objective:  Physical Exam: warm, good capillary refill, no trophic changes or ulcerative lesions, normal sensory exam, and tinea pedis.  Radiographs: Multiple views x-ray of both feet: Moderate degenerative midfoot changes and calcifications of vessels Assessment:   1. Bilateral foot pain   2. Tinea pedis of both feet   3. Idiopathic neuropathy      Plan:  Patient was evaluated and treated and all questions answered.  Discussed the etiology and treatment options for tinea pedis.  Discussed topical and oral treatment.  Recommended topical treatment with 2% ketoconazole cream.  This was sent to the patient's pharmacy.  Also discussed appropriate foot hygiene, use of antifungal spray such as Tinactin in shoes, as well as cleaning her foot surfaces such as showers and bathroom floors with bleach.  We also discussed he likely has some age-related idiopathic peripheral neuropathy.  Currently not painful enough for him that would necessitate therapy with gabapentin or Lyrica.  Follow-up as needed for this.  Return if symptoms worsen or fail to improve.

## 2022-12-12 ENCOUNTER — Encounter (HOSPITAL_COMMUNITY): Payer: Self-pay

## 2022-12-12 ENCOUNTER — Emergency Department (HOSPITAL_COMMUNITY)
Admission: EM | Admit: 2022-12-12 | Discharge: 2022-12-12 | Discharge status: Against Medical Advice | Payer: Medicare HMO | Attending: Emergency Medicine | Admitting: Emergency Medicine

## 2022-12-12 ENCOUNTER — Emergency Department (HOSPITAL_COMMUNITY): Payer: Medicare HMO

## 2022-12-12 ENCOUNTER — Other Ambulatory Visit: Payer: Self-pay

## 2022-12-12 DIAGNOSIS — Z8546 Personal history of malignant neoplasm of prostate: Secondary | ICD-10-CM | POA: Insufficient documentation

## 2022-12-12 DIAGNOSIS — I10 Essential (primary) hypertension: Secondary | ICD-10-CM | POA: Diagnosis not present

## 2022-12-12 DIAGNOSIS — M79604 Pain in right leg: Secondary | ICD-10-CM | POA: Insufficient documentation

## 2022-12-12 DIAGNOSIS — M7989 Other specified soft tissue disorders: Secondary | ICD-10-CM | POA: Diagnosis present

## 2022-12-12 DIAGNOSIS — R079 Chest pain, unspecified: Secondary | ICD-10-CM

## 2022-12-12 DIAGNOSIS — R600 Localized edema: Secondary | ICD-10-CM | POA: Insufficient documentation

## 2022-12-12 DIAGNOSIS — R7989 Other specified abnormal findings of blood chemistry: Secondary | ICD-10-CM | POA: Diagnosis not present

## 2022-12-12 DIAGNOSIS — Z5329 Procedure and treatment not carried out because of patient's decision for other reasons: Secondary | ICD-10-CM | POA: Insufficient documentation

## 2022-12-12 LAB — CBC WITH DIFFERENTIAL/PLATELET
Abs Immature Granulocytes: 0.01 10*3/uL (ref 0.00–0.07)
Basophils Absolute: 0.1 10*3/uL (ref 0.0–0.1)
Basophils Relative: 2 %
Eosinophils Absolute: 0.2 10*3/uL (ref 0.0–0.5)
Eosinophils Relative: 5 %
HCT: 39.5 % (ref 39.0–52.0)
Hemoglobin: 13 g/dL (ref 13.0–17.0)
Immature Granulocytes: 0 %
Lymphocytes Relative: 39 %
Lymphs Abs: 1.3 10*3/uL (ref 0.7–4.0)
MCH: 31.9 pg (ref 26.0–34.0)
MCHC: 32.9 g/dL (ref 30.0–36.0)
MCV: 97.1 fL (ref 80.0–100.0)
Monocytes Absolute: 0.4 10*3/uL (ref 0.1–1.0)
Monocytes Relative: 11 %
Neutro Abs: 1.4 10*3/uL — ABNORMAL LOW (ref 1.7–7.7)
Neutrophils Relative %: 43 %
Platelets: 166 10*3/uL (ref 150–400)
RBC: 4.07 MIL/uL — ABNORMAL LOW (ref 4.22–5.81)
RDW: 12.9 % (ref 11.5–15.5)
WBC: 3.2 10*3/uL — ABNORMAL LOW (ref 4.0–10.5)
nRBC: 0 % (ref 0.0–0.2)

## 2022-12-12 LAB — COMPREHENSIVE METABOLIC PANEL
ALT: 25 U/L (ref 0–44)
AST: 26 U/L (ref 15–41)
Albumin: 3.8 g/dL (ref 3.5–5.0)
Alkaline Phosphatase: 120 U/L (ref 38–126)
Anion gap: 10 (ref 5–15)
BUN: 15 mg/dL (ref 8–23)
CO2: 22 mmol/L (ref 22–32)
Calcium: 8.5 mg/dL — ABNORMAL LOW (ref 8.9–10.3)
Chloride: 97 mmol/L — ABNORMAL LOW (ref 98–111)
Creatinine, Ser: 0.9 mg/dL (ref 0.61–1.24)
GFR, Estimated: >60 mL/min (ref >60)
Glucose, Bld: 86 mg/dL (ref 70–99)
Potassium: 4.3 mmol/L (ref 3.5–5.1)
Sodium: 129 mmol/L — ABNORMAL LOW (ref 135–145)
Total Bilirubin: 1.8 mg/dL — ABNORMAL HIGH (ref 0.3–1.2)
Total Protein: 6.8 g/dL (ref 6.5–8.1)

## 2022-12-12 LAB — TROPONIN I (HIGH SENSITIVITY): Troponin I (High Sensitivity): 3 ng/L (ref <18)

## 2022-12-12 LAB — BRAIN NATRIURETIC PEPTIDE: B Natriuretic Peptide: 153.7 pg/mL — ABNORMAL HIGH (ref 0.0–100.0)

## 2022-12-12 MED ORDER — HYDRALAZINE HCL 20 MG/ML IJ SOLN
10.0000 mg | Freq: Once | INTRAMUSCULAR | Status: AC
  Administered 2022-12-12: 10 mg via INTRAVENOUS
  Filled 2022-12-12: qty 1

## 2022-12-12 NOTE — ED Provider Notes (Signed)
[place] EMERGENCY DEPARTMENT AT [facility] Provider Note   CSN: [id number] Arrival date & time: 12/12/22  4696     History  Chief Complaint  Patient presents with   Hypertension   Leg Swelling    Troy Salinas is a 89 y.o. male history of prostate cancer, who presents to the ED secondary to elevated blood pressure for the last couple weeks, and chest pain with shortness of breath, has been ongoing for the last couple months.  He states when he is walking, or doing certain activities, he has left-sided chest pain, that is pressurized, and associated with shortness of breath, that goes away after he stopped doing an activity.  Has not taken anything for this.  Has not seen a doctor for this.  Additionally here for bilateral lower extremity swelling, this been going on for the last couple months, he states is gotten worse, and that yesterday he started having some sharp shooting pains in the right leg, that was relieved by elevating the leg.  Denies any coolness, or changes in color of his leg.  Denies any recent trauma.  Home Medications Prior to Admission medications   Medication Sig Start Date End Date Taking? Authorizing Provider  cetirizine (ZYRTEC) 5 MG tablet Take 1 tablet (5 mg total) by mouth daily. 11/03/22   Raspet, Noberto Retort, PA-C  dorzolamide-timolol (COSOPT) 2-0.5 % ophthalmic solution  09/23/19   [provider]  fluticasone (FLONASE) 50 MCG/ACT nasal spray Place 1 spray into both nostrils daily. 02/23/22   Raspet, Noberto Retort, PA-C  ketoconazole (NIZORAL) 2 % cream Apply 1 Application topically 2 (two) times daily. 11/10/22   McDonald, Rachelle Hora, DPM  latanoprost (XALATAN) 0.005 % ophthalmic solution Place 1 drop into both eyes at bedtime. 11/15/18   [provider]  OVER THE COUNTER MEDICATION A and C pill Fish oil Latanoprost eye drops clotrimazole Patient not taking: Reported on 12/08/2021    [provider]  triamcinolone cream (KENALOG) 0.1 %  Apply 1 Application topically 2 (two) times daily. 11/03/22   Raspet, Noberto Retort, PA-C      Allergies    Patient has no known allergies.    Review of Systems   Review of Systems  Respiratory:  Positive for shortness of breath.   Cardiovascular:  Positive for chest pain and leg swelling. Negative for palpitations.    Physical Exam Updated Vital Signs BP (!) 160/66   Pulse (!) 57   Temp (!) 97.5 F (36.4 C) (Oral)   Resp 18   Ht 5\' 10"  (1.778 m)   Wt 68 kg   SpO2 100%   BMI 21.52 kg/m  Physical Exam Vitals and nursing note reviewed.  Constitutional:      General: He is not in acute distress.    Appearance: He is well-developed.  HENT:     Head: Normocephalic and atraumatic.  Eyes:     Conjunctiva/sclera: Conjunctivae normal.  Cardiovascular:     Rate and Rhythm: Normal rate and regular rhythm.     Pulses: Normal pulses.          Dorsalis pedis pulses are 2+ on the right side and 2+ on the left side.     Heart sounds: No murmur heard. Pulmonary:     Effort: Pulmonary effort is normal. No respiratory distress.     Breath sounds: Normal breath sounds.  Abdominal:     Palpations: Abdomen is soft.     Tenderness: There is no abdominal tenderness.  Musculoskeletal:        General: No swelling.     Cervical back: Neck supple.     Right lower leg: 1+ Pitting Edema present.     Left lower leg: 1+ Pitting Edema present.  Skin:    General: Skin is warm and dry.     Capillary Refill: Capillary refill takes less than 2 seconds.     Comments: No rash  Neurological:     Mental Status: He is alert.  Psychiatric:        Mood and Affect: Mood normal.     ED Results / Procedures / Treatments   Labs (all labs ordered are listed, but only abnormal results are displayed) Labs Reviewed  CBC WITH DIFFERENTIAL/PLATELET - Abnormal; Notable for the following components:      Result Value   WBC 3.2 (*)    RBC 4.07 (*)    Neutro Abs 1.4 (*)    All other components within normal  limits  COMPREHENSIVE METABOLIC PANEL - Abnormal; Notable for the following components:   Sodium 129 (*)    Chloride 97 (*)    Calcium 8.5 (*)    Total Bilirubin 1.8 (*)    All other components within normal limits  BRAIN NATRIURETIC PEPTIDE - Abnormal; Notable for the following components:   B Natriuretic Peptide 153.7 (*)    All other components within normal limits  TROPONIN I (HIGH SENSITIVITY)  TROPONIN I (HIGH SENSITIVITY)    EKG EKG Interpretation Date/Time:  Monday December 12 2022 10:18:56 EDT Ventricular Rate:  53 PR Interval:  249 QRS Duration:  107 QT Interval:  461 QTC Calculation: 433 R Axis:   80  Text Interpretation: Sinus bradycardia Prolonged PR interval Probable left atrial enlargement Confirmed by Ernie Avena ([id number]) on 12/12/2022 11:47:14 AM  Radiology DG Chest 2 View  Result Date: 12/12/2022 CLINICAL DATA:  chest pain, sob EXAM: CHEST - 2 VIEW COMPARISON:  None Available. FINDINGS: The heart size and mediastinal contours are within normal limits. Both lungs are clear. No visible pleural effusions or pneumothorax. No acute osseous abnormality. IMPRESSION: No active cardiopulmonary disease. Electronically Signed   By: Feliberto Harts M.D.   On: 12/12/2022 11:15    Procedures Procedures    Medications Ordered in ED Medications  hydrALAZINE (APRESOLINE) injection 10 mg (10 mg Intravenous Given 12/12/22 1044)    ED Course/ Medical Decision Making/ A&P                                 Medical Decision Making Patient is an 89 year old male, here for several complaints including chest pain, that is intermittently been going on for the last few months, with shortness of breath, we will obtain a troponins, EKG and chest x-ray for this.  Additionally reports that he has had some swelling of his bilateral lower extremities, for the last few weeks, and that he had a lot of right leg pain yesterday.  He has good pulses, no color change, however given the pain, we  will obtain ultrasounds to evaluate for kind of any kind of blood clot.  Amount and/or Complexity of Data Reviewed Labs: ordered.    Details: BNP mildly elevated, no electrolyte abnormalities, normal troponin Radiology: ordered.    Details: Chest x-ray clear ECG/medicine tests:  Decision-making details documented in ED Course. Discussion of management or test interpretation with external provider(s): Discussed with patient, BNP is mildly elevated, pending ultrasound of  legs, he has decided to leave AMA according to nursing staff.  His blood pressure was improved by the time that he left AMA, but I was unable to send him any medications as he left without me knowing.    Risk Prescription drug management.    Final Clinical Impression(s) / ED Diagnoses Final diagnoses:  Bilateral edema of lower extremity  Right leg pain  Hypertension, unspecified type  Chest pain, unspecified type    Rx / DC Orders ED Discharge Orders     None         Pete Pelt, PA 12/12/22 1235    Ernie Avena, MD 12/12/22 1312

## 2022-12-12 NOTE — ED Notes (Addendum)
Patient transported to radiology

## 2022-12-12 NOTE — ED Notes (Signed)
Pt decided he had waited long enough and left AMA.  EDP aware.

## 2023-01-03 ENCOUNTER — Other Ambulatory Visit: Payer: Self-pay

## 2023-01-03 ENCOUNTER — Encounter (HOSPITAL_COMMUNITY): Payer: Self-pay | Admitting: Emergency Medicine

## 2023-01-03 ENCOUNTER — Ambulatory Visit (HOSPITAL_COMMUNITY)
Admission: EM | Admit: 2023-01-03 | Discharge: 2023-01-03 | Disposition: A | Discharge status: Home / Self Care | Payer: Medicare HMO

## 2023-01-03 DIAGNOSIS — R6889 Other general symptoms and signs: Secondary | ICD-10-CM | POA: Diagnosis not present

## 2023-01-03 NOTE — ED Triage Notes (Signed)
Patient states he is concern about his neck is making some cracking noises when he turns his head side to side. Denies any pain.

## 2023-01-03 NOTE — ED Provider Notes (Signed)
[facility]    CSN: [id number] Arrival date & time: 01/03/23  1541      History   Chief Complaint Chief Complaint  Patient presents with   Torticollis    HPI Troy Salinas is a 89 y.o. male.   Patient presents with neck "popping" with movement x 1 week.  Denies pain and swelling.      Past Medical History:  Diagnosis Date   Hyperlipidemia    Prostate cancer [facility])    Tinnitus     Patient Active Problem List   Diagnosis Date Noted   Glaucoma 12/08/2017   Skin macule 11/26/2014   Cataracts, bilateral 11/26/2014   Junctional rhythm 09/05/2014   ADENOCARCINOMA, PROSTATE 10/23/2009   HYPERTROPHY PROSTATE W/O UR OBST & OTH LUTS 05/25/2009    Past Surgical History:  Procedure Laterality Date   COLONOSCOPY  2013   HERNIA REPAIR     had 2   TONSILECTOMY, ADENOIDECTOMY, BILATERAL MYRINGOTOMY AND TUBES  1942       Home Medications    Prior to Admission medications   Medication Sig Start Date End Date Taking? Authorizing Provider  cetirizine (ZYRTEC) 5 MG tablet Take 1 tablet (5 mg total) by mouth daily. 11/03/22   Raspet, Noberto Retort, PA-C  dorzolamide-timolol (COSOPT) 2-0.5 % ophthalmic solution  09/23/19   [provider]  fluticasone (FLONASE) 50 MCG/ACT nasal spray Place 1 spray into both nostrils daily. 02/23/22   Raspet, Noberto Retort, PA-C  ketoconazole (NIZORAL) 2 % cream Apply 1 Application topically 2 (two) times daily. 11/10/22   McDonald, Rachelle Hora, DPM  latanoprost (XALATAN) 0.005 % ophthalmic solution Place 1 drop into both eyes at bedtime. 11/15/18   [provider]  OVER THE COUNTER MEDICATION A and C pill Fish oil Latanoprost eye drops clotrimazole Patient not taking: Reported on 12/08/2021    [provider]  triamcinolone cream (KENALOG) 0.1 % Apply 1 Application topically 2 (two) times daily. 11/03/22   Raspet, Noberto Retort, PA-C    Family History Family History  Problem Relation Age of Onset   Healthy Mother    Cataracts Mother     Bone cancer Sister    Colon cancer Sister    Colon cancer Brother    Cancer Maternal Grandmother    Healthy Maternal Grandfather    Healthy Paternal Grandmother    Healthy Paternal Grandfather     Social History Social History   Tobacco Use   Smoking status: Former   Smokeless tobacco: Never  Vaping Use   Vaping status: Never Used  Substance Use Topics   Alcohol use: Yes    Comment: rarely   Drug use: Never     Allergies   Patient has no known allergies.   Review of Systems Review of Systems  Constitutional:  Negative for fatigue and fever.  Musculoskeletal:  Negative for back pain, joint swelling, neck pain and neck stiffness.  Neurological:  Negative for weakness, light-headedness, numbness and headaches.     Physical Exam Triage Vital Signs ED Triage Vitals  Encounter Vitals Group     BP 01/03/23 1633 (!) 170/80     Systolic BP Percentile --      Diastolic BP Percentile --      Pulse Rate 01/03/23 1633 (!) 46     Resp 01/03/23 1633 18     Temp 01/03/23 1633 98.2 F (36.8 C)     Temp Source 01/03/23 1633 Oral     SpO2 01/03/23 1633 98 %  Weight --      Height --      Head Circumference --      Peak Flow --      Pain Score 01/03/23 1634 0     Pain Loc --      Pain Education --      Exclude from Growth Chart --    No data found.  Updated Vital Signs BP (!) 170/80 (BP Location: Right Arm)   Pulse (!) 46   Temp 98.2 F (36.8 C) (Oral)   Resp 18   SpO2 98%   Visual Acuity Right Eye Distance:   Left Eye Distance:   Bilateral Distance:    Right Eye Near:   Left Eye Near:    Bilateral Near:     Physical Exam Vitals and nursing note reviewed.  Constitutional:      General: He is awake. He is not in acute distress.    Appearance: Normal appearance. He is well-developed and well-groomed. He is not ill-appearing, toxic-appearing or diaphoretic.  Musculoskeletal:        General: Normal range of motion.     Cervical back: Full passive  range of motion without pain and normal range of motion. Crepitus present. No swelling, edema, deformity, erythema, signs of trauma, rigidity, torticollis, tenderness or bony tenderness. No pain with movement. Normal range of motion.  Lymphadenopathy:     Cervical: No cervical adenopathy.  Skin:    General: Skin is warm and dry.  Neurological:     Mental Status: He is alert.  Psychiatric:        Behavior: Behavior is cooperative.      UC Treatments / Results  Labs (all labs ordered are listed, but only abnormal results are displayed) Labs Reviewed - No data to display  EKG   Radiology No results found.  Procedures Procedures (including critical care time)  Medications Ordered in UC Medications - No data to display  Initial Impression / Assessment and Plan / UC Course  I have reviewed the triage vital signs and the nursing notes.  Pertinent labs & imaging results that were available during my care of the patient were reviewed by me and considered in my medical decision making (see chart for details).     Patient presented with 1 week history of neck "popping" and "cracking". Denies pain, swelling, back pain, and numbness.  Denies any injury. Reports that he has been working on his computer a lot at home and sitting in a slumped position.  Slightly audible crepitus noted upon neck movement.  BP is elevated in clinic. Denies dizziness, weakness, numbness, and headaches. Patient states he has been under some stress lately related to personal problems, and thinks his blood pressure may be elevated from this. Discussed following up with PCP regarding blood pressure management.  Discussed following up with orthopedic doctor if his neck or develops pain.  Discussed return precautions. Final Clinical Impressions(s) / UC Diagnoses   Final diagnoses:  Neck complaint     Discharge Instructions      Try to maintain good posture when working on computer and stay active. You can  follow-up with [facility] for concerns of neck popping or if you start to develop neck pain. Please follow-up with primary care doctor regarding blood pressure if it continues to be elevated. Return here as needed.     ED Prescriptions   None    PDMP not reviewed this encounter.   Wynonia Lawman A, NP 01/03/23 1700

## 2023-01-03 NOTE — Discharge Instructions (Addendum)
Try to maintain good posture when working on computer and stay active. You can follow-up with [facility] for concerns of neck popping or if you start to develop neck pain. Please follow-up with primary care doctor regarding blood pressure if it continues to be elevated. Return here as needed.

## 2023-03-04 ENCOUNTER — Ambulatory Visit (HOSPITAL_COMMUNITY)
Admission: EM | Admit: 2023-03-04 | Discharge: 2023-03-04 | Disposition: A | Discharge status: Home / Self Care | Payer: Medicare HMO | Attending: Physician Assistant | Admitting: Physician Assistant

## 2023-03-04 ENCOUNTER — Encounter (HOSPITAL_COMMUNITY): Payer: Self-pay

## 2023-03-04 DIAGNOSIS — Q809 Congenital ichthyosis, unspecified: Secondary | ICD-10-CM

## 2023-03-04 DIAGNOSIS — L299 Pruritus, unspecified: Secondary | ICD-10-CM

## 2023-03-04 MED ORDER — AQUAPHOR EX OINT: TOPICAL_OINTMENT | CUTANEOUS | 1 refills | Status: AC | PRN

## 2023-03-04 NOTE — ED Provider Notes (Signed)
[facility]    CSN: [id number] Arrival date & time: 03/04/23  1609      History   Chief Complaint Chief Complaint  Patient presents with   Rash    HPI Troy Salinas is a 89 y.o. male.   Patient presents today with a several week history of intermittent widespread itching.  He reports that involves all of his body but not at the same time; he will have areas on his lower legs, trunk, neck.  He has not noticed any significant rash.  He has tried anti-itch cream over-the-counter with normal improvement of symptoms.  He does report that prior to this he was not taking showers regularly and has noticed that symptoms improve after he takes a shower.  He denies any changes to personal hygiene products including soaps or detergents.  Denies any chemical exposure or exposure to plants, animals, insects.  Denies history of dermatological condition.  He was initially concerned that symptoms could be related to insulation spread into his walls as this is a expanding foam and he was concerned that maybe the chemicals were bothersome; this was done 3 years ago and he denies any additional housework or exposure.  Reports that he is feeling better after taking a shower earlier today and is currently asymptomatic.    Past Medical History:  Diagnosis Date   Hyperlipidemia    Prostate cancer [facility])    Tinnitus     Patient Active Problem List   Diagnosis Date Noted   Glaucoma 12/08/2017   Skin macule 11/26/2014   Cataracts, bilateral 11/26/2014   Junctional rhythm 09/05/2014   ADENOCARCINOMA, PROSTATE 10/23/2009   HYPERTROPHY PROSTATE W/O UR OBST & OTH LUTS 05/25/2009    Past Surgical History:  Procedure Laterality Date   COLONOSCOPY  2013   HERNIA REPAIR     had 2   TONSILECTOMY, ADENOIDECTOMY, BILATERAL MYRINGOTOMY AND TUBES  1942       Home Medications    Prior to Admission medications   Medication Sig Start Date End Date Taking? Authorizing Provider  mineral  oil-hydrophilic petrolatum (AQUAPHOR) ointment Apply topically as needed for dry skin. 03/04/23  Yes Micheala Morissette K, PA-C  cetirizine (ZYRTEC) 5 MG tablet Take 1 tablet (5 mg total) by mouth daily. 11/03/22   Lucella Pommier, Noberto Retort, PA-C  dorzolamide-timolol (COSOPT) 2-0.5 % ophthalmic solution  09/23/19   [provider]  fluticasone (FLONASE) 50 MCG/ACT nasal spray Place 1 spray into both nostrils daily. 02/23/22   Daniell Mancinas, Noberto Retort, PA-C  ketoconazole (NIZORAL) 2 % cream Apply 1 Application topically 2 (two) times daily. 11/10/22   McDonald, Rachelle Hora, DPM  latanoprost (XALATAN) 0.005 % ophthalmic solution Place 1 drop into both eyes at bedtime. 11/15/18   [provider]  OVER THE COUNTER MEDICATION A and C pill Fish oil Latanoprost eye drops clotrimazole Patient not taking: Reported on 12/08/2021    [provider]  triamcinolone cream (KENALOG) 0.1 % Apply 1 Application topically 2 (two) times daily. 11/03/22   Temari Schooler, Noberto Retort, PA-C    Family History Family History  Problem Relation Age of Onset   Healthy Mother    Cataracts Mother    Bone cancer Sister    Colon cancer Sister    Colon cancer Brother    Cancer Maternal Grandmother    Healthy Maternal Grandfather    Healthy Paternal Grandmother    Healthy Paternal Grandfather     Social History Social History   Tobacco Use   Smoking  status: Former   Smokeless tobacco: Never  Vaping Use   Vaping status: Never Used  Substance Use Topics   Alcohol use: Yes    Comment: rarely   Drug use: Never     Allergies   Patient has no known allergies.   Review of Systems Review of Systems  Constitutional:  Negative for activity change, appetite change, fatigue and fever.  Gastrointestinal:  Negative for abdominal pain, diarrhea, nausea and vomiting.  Musculoskeletal:  Negative for arthralgias and myalgias.  Skin:  Negative for color change and wound.     Physical Exam Triage Vital Signs ED Triage Vitals  Encounter  Vitals Group     BP 03/04/23 1718 127/78     Systolic BP Percentile --      Diastolic BP Percentile --      Pulse Rate 03/04/23 1719 76     Resp 03/04/23 1718 16     Temp 03/04/23 1719 97.8 F (36.6 C)     Temp src --      SpO2 03/04/23 1718 98 %     Weight --      Height --      Head Circumference --      Peak Flow --      Pain Score 03/04/23 1723 0     Pain Loc --      Pain Education --      Exclude from Growth Chart --    No data found.  Updated Vital Signs BP 127/78 (BP Location: Left Arm)   Pulse 76   Temp 97.8 F (36.6 C)   Resp 16   SpO2 98%   Visual Acuity Right Eye Distance:   Left Eye Distance:   Bilateral Distance:    Right Eye Near:   Left Eye Near:    Bilateral Near:     Physical Exam Vitals reviewed.  Constitutional:      General: He is awake.     Appearance: Normal appearance. He is well-developed. He is not ill-appearing.     Comments: Very pleasant male appears younger than stated age in no acute distress  HENT:     Head: Normocephalic and atraumatic.  Cardiovascular:     Rate and Rhythm: Normal rate and regular rhythm.     Heart sounds: Normal heart sounds, S1 normal and S2 normal. No murmur heard. Pulmonary:     Effort: Pulmonary effort is normal.     Breath sounds: Normal breath sounds. No stridor. No wheezing, rhonchi or rales.     Comments: Clear to auscultation bilaterally Skin:    Findings: No rash.     Comments: Excoriation noted medial right lower leg with no associated rash.  Neurological:     Mental Status: He is alert.  Psychiatric:        Behavior: Behavior is cooperative.      UC Treatments / Results  Labs (all labs ordered are listed, but only abnormal results are displayed) Labs Reviewed - No data to display  EKG   Radiology No results found.  Procedures Procedures (including critical care time)  Medications Ordered in UC Medications - No data to display  Initial Impression / Assessment and Plan / UC  Course  I have reviewed the triage vital signs and the nursing notes.  Pertinent labs & imaging results that were available during my care of the patient were reviewed by me and considered in my medical decision making (see chart for details).     Patient is  well-appearing, afebrile, nontoxic, nontachycardic.  Reports that his symptoms have since resolved.  Discussed symptoms could be related to xeroderma particularly given the recent cold weather.  He was encouraged to use hypoallergenic soaps and detergents and apply emollient to bothersome areas.  Aquaphor was sent to his pharmacy.  We did discuss that if he has a widespread ongoing systemic pruritus without a rash it would be reasonable to get blood work, however, given he is currently asymptomatic we will defer additional workup for the time being.  Recommend close follow-up with his primary care.  Discussed that if anything worsens or changes he should return for reevaluation.  Final Clinical Impressions(s) / UC Diagnoses   Final diagnoses:  Xeroderma  Pruritus     Discharge Instructions      I believe that your symptoms are related to dry skin.  As we discussed, use hypoallergenic soaps and detergents and take a shower every day.  Apply Aquaphor ointment to specific bothersome areas.  If anything changes or worsens and you have widespread rash, increased itching please return for further evaluation.     ED Prescriptions     Medication Sig Dispense Auth. Provider   mineral oil-hydrophilic petrolatum (AQUAPHOR) ointment Apply topically as needed for dry skin. 420 g Piers Baade, Noberto Retort, PA-C      PDMP not reviewed this encounter.   Jeani Hawking, PA-C 03/04/23 1759

## 2023-03-04 NOTE — Discharge Instructions (Signed)
I believe that your symptoms are related to dry skin.  As we discussed, use hypoallergenic soaps and detergents and take a shower every day.  Apply Aquaphor ointment to specific bothersome areas.  If anything changes or worsens and you have widespread rash, increased itching please return for further evaluation.

## 2023-03-04 NOTE — ED Triage Notes (Signed)
Patient states he is itching all over x 2 weeks.

## 2023-04-03 ENCOUNTER — Emergency Department (HOSPITAL_COMMUNITY)
Admission: EM | Admit: 2023-04-03 | Discharge: 2023-04-04 | Discharge status: Against Medical Advice | Payer: Medicare HMO | Attending: Emergency Medicine | Admitting: Emergency Medicine

## 2023-04-03 DIAGNOSIS — R0789 Other chest pain: Secondary | ICD-10-CM | POA: Diagnosis present

## 2023-04-03 DIAGNOSIS — Z5321 Procedure and treatment not carried out due to patient leaving prior to being seen by health care provider: Secondary | ICD-10-CM | POA: Diagnosis not present

## 2023-04-03 NOTE — ED Notes (Signed)
This patient decided to leave. The patient stated "I am more stressed out here than at home and I feel better." This tech explained that it was important to see the triage nurse but he wanted to go home.

## 2023-05-25 ENCOUNTER — Other Ambulatory Visit (HOSPITAL_COMMUNITY): Payer: Self-pay

## 2023-06-19 ENCOUNTER — Encounter (HOSPITAL_COMMUNITY): Payer: Self-pay | Admitting: Emergency Medicine

## 2023-06-19 ENCOUNTER — Ambulatory Visit (HOSPITAL_COMMUNITY)
Admission: EM | Admit: 2023-06-19 | Discharge: 2023-06-19 | Disposition: A | Discharge status: Home / Self Care | Attending: Family Medicine | Admitting: Family Medicine

## 2023-06-19 ENCOUNTER — Other Ambulatory Visit: Payer: Self-pay

## 2023-06-19 DIAGNOSIS — L299 Pruritus, unspecified: Secondary | ICD-10-CM | POA: Insufficient documentation

## 2023-06-19 DIAGNOSIS — R195 Other fecal abnormalities: Secondary | ICD-10-CM | POA: Insufficient documentation

## 2023-06-19 LAB — COMPREHENSIVE METABOLIC PANEL
ALT: 25 U/L (ref 0–44)
AST: 30 U/L (ref 15–41)
Albumin: 3.8 g/dL (ref 3.5–5.0)
Alkaline Phosphatase: 125 U/L (ref 38–126)
Anion gap: 9 (ref 5–15)
BUN: 16 mg/dL (ref 8–23)
CO2: 25 mmol/L (ref 22–32)
Calcium: 8.8 mg/dL — ABNORMAL LOW (ref 8.9–10.3)
Chloride: 106 mmol/L (ref 98–111)
Creatinine, Ser: 0.92 mg/dL (ref 0.61–1.24)
GFR, Estimated: >60 mL/min (ref >60)
Glucose, Bld: 70 mg/dL (ref 70–99)
Potassium: 4.4 mmol/L (ref 3.5–5.1)
Sodium: 140 mmol/L (ref 135–145)
Total Bilirubin: 1.6 mg/dL — ABNORMAL HIGH (ref 0.0–1.2)
Total Protein: 6.7 g/dL (ref 6.5–8.1)

## 2023-06-19 LAB — CBC
HCT: 39.6 % (ref 39.0–52.0)
Hemoglobin: 13.2 g/dL (ref 13.0–17.0)
MCH: 33.2 pg (ref 26.0–34.0)
MCHC: 33.3 g/dL (ref 30.0–36.0)
MCV: 99.5 fL (ref 80.0–100.0)
Platelets: 168 10*3/uL (ref 150–400)
RBC: 3.98 MIL/uL — ABNORMAL LOW (ref 4.22–5.81)
RDW: 12.7 % (ref 11.5–15.5)
WBC: 3.4 10*3/uL — ABNORMAL LOW (ref 4.0–10.5)
nRBC: 0 % (ref 0.0–0.2)

## 2023-06-19 NOTE — Discharge Instructions (Signed)
 We have drawn blood to check your blood counts and kidney and liver numbers.  Our staff will call you if there is anything significantly abnormal.  If you start having abdominal pain, bright red blood or worsening of your stool color or dizziness, please go to the emergency room for further evaluation  I am glad you are going to get to see a PCP soon.

## 2023-06-19 NOTE — ED Provider Notes (Signed)
 [facility]    CSN: [id number] Arrival date & time: 06/19/23  1149      History   Chief Complaint No chief complaint on file.   HPI Troy Salinas is a 89 y.o. male.   HPI Here for dark-colored stool that is been soft.  Is not really sticky like tar but it is also not hard.  This began about 2 months ago.  He also started taking oral iron twice daily about 2 months ago.  He states that his doctor did say that it might cause a discoloration of his stool, but this patient does not understand what the prescription was for.  He is uncertain if he was anemic on blood work.  That PCP does not chart in care everywhere and I cannot see any lab results in the last 6 months in our epic chart.  No abdominal pain and no vomiting or fever.  No dizziness or weakness. NKDA  He had stopped taking the iron for about a week and is continue to have some dark stools.  He had had some itching before he started the iron.  The itching stopped while on the iron, and it is now returned. Past Medical History:  Diagnosis Date   Hyperlipidemia    Prostate cancer [facility])    Tinnitus     Patient Active Problem List   Diagnosis Date Noted   Glaucoma 12/08/2017   Skin macule 11/26/2014   Cataracts, bilateral 11/26/2014   Junctional rhythm 09/05/2014   ADENOCARCINOMA, PROSTATE 10/23/2009   HYPERTROPHY PROSTATE W/O UR OBST & OTH LUTS 05/25/2009    Past Surgical History:  Procedure Laterality Date   COLONOSCOPY  2013   HERNIA REPAIR     had 2   TONSILECTOMY, ADENOIDECTOMY, BILATERAL MYRINGOTOMY AND TUBES  1942       Home Medications    Prior to Admission medications   Medication Sig Start Date End Date Taking? Authorizing Provider  Netarsudil-Latanoprost (ROCKLATAN) 0.02-0.005 % SOLN PLACE 1 DROP IN BOTH EYES AT BEDTIME 07/11/21  Yes [provider]  dorzolamide-timolol (COSOPT) 2-0.5 % ophthalmic solution  09/23/19   [provider]  ketoconazole (NIZORAL) 2 % cream  Apply 1 Application topically 2 (two) times daily. 11/10/22   McDonald, Rachelle Hora, DPM  latanoprost (XALATAN) 0.005 % ophthalmic solution Place 1 drop into both eyes at bedtime. 11/15/18   [provider]  mineral oil-hydrophilic petrolatum (AQUAPHOR) ointment Apply topically as needed for dry skin. 03/04/23   Raspet, Noberto Retort, PA-C  triamcinolone cream (KENALOG) 0.1 % Apply 1 Application topically 2 (two) times daily. 11/03/22   Raspet, Noberto Retort, PA-C    Family History Family History  Problem Relation Age of Onset   Healthy Mother    Cataracts Mother    Bone cancer Sister    Colon cancer Sister    Colon cancer Brother    Cancer Maternal Grandmother    Healthy Maternal Grandfather    Healthy Paternal Grandmother    Healthy Paternal Grandfather     Social History Social History   Tobacco Use   Smoking status: Former   Smokeless tobacco: Never  Vaping Use   Vaping status: Never Used  Substance Use Topics   Alcohol use: Yes    Comment: rarely   Drug use: Never     Allergies   Patient has no known allergies.   Review of Systems Review of Systems   Physical Exam Triage Vital Signs ED Triage Vitals  Encounter Vitals Group  BP 06/19/23 1249 (!) 175/87     Systolic BP Percentile --      Diastolic BP Percentile --      Pulse Rate 06/19/23 1249 60     Resp 06/19/23 1249 20     Temp 06/19/23 1249 98.1 F (36.7 C)     Temp Source 06/19/23 1249 Oral     SpO2 06/19/23 1249 97 %     Weight --      Height --      Head Circumference --      Peak Flow --      Pain Score 06/19/23 1245 0     Pain Loc --      Pain Education --      Exclude from Growth Chart --    No data found.  Updated Vital Signs BP (!) 175/87 (BP Location: Right Arm)   Pulse 60   Temp 98.1 F (36.7 C) (Oral)   Resp 20   SpO2 97%   Visual Acuity Right Eye Distance:   Left Eye Distance:   Bilateral Distance:    Right Eye Near:   Left Eye Near:    Bilateral Near:     Physical  Exam Vitals reviewed.  Constitutional:      General: He is not in acute distress.    Appearance: He is not ill-appearing, toxic-appearing or diaphoretic.  HENT:     Mouth/Throat:     Mouth: Mucous membranes are moist.  Eyes:     Extraocular Movements: Extraocular movements intact.     Conjunctiva/sclera: Conjunctivae normal.     Pupils: Pupils are equal, round, and reactive to light.  Cardiovascular:     Rate and Rhythm: Normal rate and regular rhythm.     Heart sounds: No murmur heard. Pulmonary:     Effort: Pulmonary effort is normal.     Breath sounds: Normal breath sounds.  Abdominal:     General: Bowel sounds are normal. There is no distension.     Palpations: Abdomen is soft. There is no mass.     Tenderness: There is no abdominal tenderness. There is no guarding.  Musculoskeletal:     Cervical back: Neck supple.  Lymphadenopathy:     Cervical: No cervical adenopathy.  Skin:    Comments: Possible slight pallor; skin color is more sallow and not obviously jaundiced.  Neurological:     General: No focal deficit present.     Mental Status: He is alert and oriented to person, place, and time.  Psychiatric:        Behavior: Behavior normal.      UC Treatments / Results  Labs (all labs ordered are listed, but only abnormal results are displayed) Labs Reviewed  CBC  COMPREHENSIVE METABOLIC PANEL    EKG   Radiology No results found.  Procedures Procedures (including critical care time)  Medications Ordered in UC Medications - No data to display  Initial Impression / Assessment and Plan / UC Course  I have reviewed the triage vital signs and the nursing notes.  Pertinent labs & imaging results that were available during my care of the patient were reviewed by me and considered in my medical decision making (see chart for details).     CBC and notify him of anything significantly abnormal.  He is given contact information for gastro and he will be seeing a  new PCP later this month.  I have asked him to go to the emergency room if anything worsens or  he begins having new serious symptoms. Final Clinical Impressions(s) / UC Diagnoses   Final diagnoses:  Dark stools  Pruritus     Discharge Instructions      We have drawn blood to check your blood counts and kidney and liver numbers.  Our staff will call you if there is anything significantly abnormal.  If you start having abdominal pain, bright red blood or worsening of your stool color or dizziness, please go to the emergency room for further evaluation  I am glad you are going to get to see a PCP soon.     ED Prescriptions   None    PDMP not reviewed this encounter.   Zenia Resides, MD 06/19/23 [phone]

## 2023-06-19 NOTE — ED Triage Notes (Addendum)
 Reports BM is black  Reports a medicine his doctor gave, was told this medicine would make urine or stool dark.  Patient stopped this medicine 2 days to a week ago and stool remains dark.  Patient has Ferrous sulfate 325 mg 1 tablet BID. - this is the medicine he is referring to  Patient denies pain.    Patient also speaks about a rash to rectum

## 2023-12-26 ENCOUNTER — Encounter (HOSPITAL_COMMUNITY): Payer: Self-pay

## 2023-12-26 ENCOUNTER — Ambulatory Visit (HOSPITAL_COMMUNITY)
Admission: EM | Admit: 2023-12-26 | Discharge: 2023-12-26 | Disposition: A | Discharge status: Home / Self Care | Attending: Family Medicine | Admitting: Family Medicine

## 2023-12-26 DIAGNOSIS — R1310 Dysphagia, unspecified: Secondary | ICD-10-CM | POA: Diagnosis not present

## 2023-12-26 DIAGNOSIS — R194 Change in bowel habit: Secondary | ICD-10-CM

## 2023-12-26 DIAGNOSIS — L299 Pruritus, unspecified: Secondary | ICD-10-CM | POA: Diagnosis not present

## 2023-12-26 NOTE — ED Triage Notes (Signed)
 Pt c/o itchy rash all over x75months. States using hydrocortisone creams with no relief. Denies change in products.

## 2023-12-26 NOTE — ED Provider Notes (Addendum)
 [facility]    CSN: [id number] Arrival date & time: 12/26/23  1009      History   Chief Complaint Chief Complaint  Patient presents with   Rash    HPI Troy Salinas is a 89 y.o. male.    Rash  Here for itching and concern about cancer.  He has been having itching for several months.  No fever or rash.  He expresses concern that he wants to make sure he does not have colorectal cancer.  He has not had a bleeding in the stool.  No abdominal pain.  He describes soft stools which is a change from previous, but that has been for some months at least that he has had softer stools. He also notes some difficulty feeling something in the mid upper chest after swallowing.  Things do not necessarily hanging up there.  He has not had cough anything back up.  No weight loss and no night sweats or fevers.  Reviewed his last blood work in our chart with him.  When I saw him in March his CBC showed no anemia and his total bilirubin was 1.6 which is about stable for him.  He expresses mistrust of his current primary care provider, who does not chart in our system.   He does admit to some stress with some bills.  We have discussed that anxiety can also cause itching.  Past Medical History:  Diagnosis Date   Hyperlipidemia    Prostate cancer [facility])    Tinnitus     Patient Active Problem List   Diagnosis Date Noted   Glaucoma 12/08/2017   Skin macule 11/26/2014   Cataracts, bilateral 11/26/2014   Junctional rhythm 09/05/2014   ADENOCARCINOMA, PROSTATE 10/23/2009   HYPERTROPHY PROSTATE W/O UR OBST & OTH LUTS 05/25/2009    Past Surgical History:  Procedure Laterality Date   COLONOSCOPY  2013   HERNIA REPAIR     had 2   TONSILECTOMY, ADENOIDECTOMY, BILATERAL MYRINGOTOMY AND TUBES  1942       Home Medications    Prior to Admission medications   Medication Sig Start Date End Date Taking? Authorizing Provider  dorzolamide-timolol (COSOPT) 2-0.5 % ophthalmic solution   09/23/19   [provider]  ketoconazole  (NIZORAL ) 2 % cream Apply 1 Application topically 2 (two) times daily. 11/10/22   McDonald, Juliene SAUNDERS, DPM  latanoprost (XALATAN) 0.005 % ophthalmic solution Place 1 drop into both eyes at bedtime. 11/15/18   [provider]  mineral oil-hydrophilic petrolatum (AQUAPHOR) ointment Apply topically as needed for dry skin. 03/04/23   Raspet, Erin K, PA-C  triamcinolone  cream (KENALOG ) 0.1 % Apply 1 Application topically 2 (two) times daily. 11/03/22   Raspet, Rocky POUR, PA-C    Family History Family History  Problem Relation Age of Onset   Healthy Mother    Cataracts Mother    Bone cancer Sister    Colon cancer Sister    Colon cancer Brother    Cancer Maternal Grandmother    Healthy Maternal Grandfather    Healthy Paternal Grandmother    Healthy Paternal Grandfather     Social History Social History   Tobacco Use   Smoking status: Former   Smokeless tobacco: Never  Vaping Use   Vaping status: Never Used  Substance Use Topics   Alcohol  use: Yes    Comment: rarely   Drug use: Never     Allergies   Patient has no known allergies.   Review of Systems Review  of Systems  Skin:  Positive for rash.     Physical Exam Triage Vital Signs ED Triage Vitals  Encounter Vitals Group     BP 12/26/23 1147 133/84     Girls Systolic BP Percentile --      Girls Diastolic BP Percentile --      Boys Systolic BP Percentile --      Boys Diastolic BP Percentile --      Pulse Rate 12/26/23 1147 73     Resp 12/26/23 1147 18     Temp 12/26/23 1147 97.8 F (36.6 C)     Temp Source 12/26/23 1147 Oral     SpO2 12/26/23 1147 98 %     Weight --      Height --      Head Circumference --      Peak Flow --      Pain Score 12/26/23 1148 0     Pain Loc --      Pain Education --      Exclude from Growth Chart --    No data found.  Updated Vital Signs BP 133/84 (BP Location: Left Arm)   Pulse 73   Temp 97.8 F (36.6 C) (Oral)   Resp 18    SpO2 98%   Visual Acuity Right Eye Distance:   Left Eye Distance:   Bilateral Distance:    Right Eye Near:   Left Eye Near:    Bilateral Near:     Physical Exam Vitals reviewed.  Constitutional:      General: He is not in acute distress.    Appearance: He is not ill-appearing, toxic-appearing or diaphoretic.  HENT:     Mouth/Throat:     Mouth: Mucous membranes are moist.     Comments: Mucous membranes moist and pink and no petechia Eyes:     Extraocular Movements: Extraocular movements intact.     Conjunctiva/sclera: Conjunctivae normal.     Pupils: Pupils are equal, round, and reactive to light.  Cardiovascular:     Rate and Rhythm: Normal rate and regular rhythm.     Heart sounds: No murmur heard. Pulmonary:     Effort: Pulmonary effort is normal.     Breath sounds: Normal breath sounds.  Abdominal:     General: Bowel sounds are normal. There is no distension.     Palpations: Abdomen is soft. There is no mass.     Tenderness: There is no abdominal tenderness. There is no guarding.     Hernia: No hernia is present.  Musculoskeletal:     Cervical back: Neck supple.  Lymphadenopathy:     Cervical: No cervical adenopathy.  Skin:    Coloration: Skin is not jaundiced or pale.     Comments: No rashes evident.  No jaundice at this time.  Neurological:     General: No focal deficit present.     Mental Status: He is alert and oriented to person, place, and time.  Psychiatric:        Behavior: Behavior normal.      UC Treatments / Results  Labs (all labs ordered are listed, but only abnormal results are displayed) Labs Reviewed - No data to display  EKG   Radiology No results found.  Procedures Procedures (including critical care time)  Medications Ordered in UC Medications - No data to display  Initial Impression / Assessment and Plan / UC Course  I have reviewed the triage vital signs and the nursing notes.  Pertinent labs &  imaging results that were  available during my care of the patient were reviewed by me and considered in my medical decision making (see chart for details).     We discussed considering redoing his blood work, but he is not interested in that today.  I think since he does not really have any new symptoms, that is all right not to do blood work at this time.  Since he might be having bowel habit changes and he might be describing some dysphagia, I have given him contact information for gastroenterology.  Staff will help him make a new appointment with a new primary care provider   Final Clinical Impressions(s) / UC Diagnoses   Final diagnoses:  Pruritus  Dysphagia, unspecified type  Bowel habit changes     Discharge Instructions      Please call the gastroenterology office for an appointment for an evaluation.  Please follow-up with primary care also.    ED Prescriptions   None    PDMP not reviewed this encounter.   Vonna Sharlet POUR, MD 12/26/23 1235    Vonna Sharlet POUR, MD 12/26/23 [phone]

## 2023-12-26 NOTE — Discharge Instructions (Signed)
 Please call the gastroenterology office for an appointment for an evaluation.  Please follow-up with primary care also.

## 2024-02-23 ENCOUNTER — Encounter (HOSPITAL_COMMUNITY): Payer: Self-pay

## 2024-02-23 ENCOUNTER — Ambulatory Visit (HOSPITAL_COMMUNITY)

## 2024-02-23 ENCOUNTER — Ambulatory Visit (HOSPITAL_COMMUNITY)
Admission: EM | Admit: 2024-02-23 | Discharge: 2024-02-23 | Disposition: A | Discharge status: Home / Self Care | Attending: Emergency Medicine | Admitting: Emergency Medicine

## 2024-02-23 DIAGNOSIS — R051 Acute cough: Secondary | ICD-10-CM

## 2024-02-23 DIAGNOSIS — R030 Elevated blood-pressure reading, without diagnosis of hypertension: Secondary | ICD-10-CM

## 2024-02-23 DIAGNOSIS — J988 Other specified respiratory disorders: Secondary | ICD-10-CM

## 2024-02-23 DIAGNOSIS — B9789 Other viral agents as the cause of diseases classified elsewhere: Secondary | ICD-10-CM

## 2024-02-23 LAB — POC COVID19/FLU A&B COMBO
Covid Antigen, POC: NEGATIVE
Influenza A Antigen, POC: NEGATIVE
Influenza B Antigen, POC: NEGATIVE

## 2024-02-23 MED ORDER — BENZONATATE 100 MG PO CAPS: 100.0000 mg | ORAL_CAPSULE | Freq: Three times a day (TID) | ORAL | 0 refills | Status: AC

## 2024-02-23 NOTE — ED Triage Notes (Signed)
 Pt c/o runny nose and cough x1wk. States his teeth and mouth feels like sand paper. States got his teeth deep cleaning done a month ago.

## 2024-02-23 NOTE — ED Provider Notes (Signed)
 [facility]    CSN: [id number] Arrival date & time: 02/23/24  1406      History   Chief Complaint Chief Complaint  Patient presents with   Nasal Congestion   Cough    HPI Troy Salinas is a 89 y.o. male.   Patient presents with productive cough and nasal congestion that began on 11/5.  Patient states that at times his cough will become uncontrollable and he will have some mild shortness of breath with this.  Patient states he has also had a little discomfort in his chest with coughing as well.  Patient also reported that his teeth have felt like sandpaper for about a month now after he had a deep cleaning at his dentist.  Patient denies any dental pain, swelling, or drainage.  The history is provided by the patient and medical records.  Cough   Past Medical History:  Diagnosis Date   Hyperlipidemia    Prostate cancer [facility])    Tinnitus     Patient Active Problem List   Diagnosis Date Noted   Glaucoma 12/08/2017   Skin macule 11/26/2014   Cataracts, bilateral 11/26/2014   Junctional rhythm 09/05/2014   ADENOCARCINOMA, PROSTATE 10/23/2009   HYPERTROPHY PROSTATE W/O UR OBST & OTH LUTS 05/25/2009    Past Surgical History:  Procedure Laterality Date   COLONOSCOPY  2013   HERNIA REPAIR     had 2   TONSILECTOMY, ADENOIDECTOMY, BILATERAL MYRINGOTOMY AND TUBES  1942       Home Medications    Prior to Admission medications   Medication Sig Start Date End Date Taking? Authorizing Provider  benzonatate (TESSALON) 100 MG capsule Take 1 capsule (100 mg total) by mouth every 8 (eight) hours. 02/23/24  Yes Johnie, Haydon Dorris A, NP  dorzolamide-timolol (COSOPT) 2-0.5 % ophthalmic solution  09/23/19   [provider]  ketoconazole  (NIZORAL ) 2 % cream Apply 1 Application topically 2 (two) times daily. 11/10/22   McDonald, Juliene SAUNDERS, DPM  latanoprost (XALATAN) 0.005 % ophthalmic solution Place 1 drop into both eyes at bedtime. 11/15/18   [provider]   mineral oil-hydrophilic petrolatum (AQUAPHOR) ointment Apply topically as needed for dry skin. 03/04/23   Raspet, Erin K, PA-C  triamcinolone  cream (KENALOG ) 0.1 % Apply 1 Application topically 2 (two) times daily. 11/03/22   Raspet, Rocky POUR, PA-C    Family History Family History  Problem Relation Age of Onset   Healthy Mother    Cataracts Mother    Bone cancer Sister    Colon cancer Sister    Colon cancer Brother    Cancer Maternal Grandmother    Healthy Maternal Grandfather    Healthy Paternal Grandmother    Healthy Paternal Grandfather     Social History Social History   Tobacco Use   Smoking status: Former   Smokeless tobacco: Never  Vaping Use   Vaping status: Never Used  Substance Use Topics   Alcohol  use: Yes    Comment: rarely   Drug use: Never     Allergies   Patient has no known allergies.   Review of Systems Review of Systems  Respiratory:  Positive for cough.    Per HPI  Physical Exam Triage Vital Signs ED Triage Vitals  Encounter Vitals Group     BP 02/23/24 1636 (!) 175/73     Girls Systolic BP Percentile --      Girls Diastolic BP Percentile --      Boys Systolic BP Percentile --  Boys Diastolic BP Percentile --      Pulse Rate 02/23/24 1636 65     Resp 02/23/24 1636 18     Temp 02/23/24 1636 98.6 F (37 C)     Temp Source 02/23/24 1636 Oral     SpO2 02/23/24 1636 97 %     Weight --      Height --      Head Circumference --      Peak Flow --      Pain Score 02/23/24 1634 0     Pain Loc --      Pain Education --      Exclude from Growth Chart --    No data found.  Updated Vital Signs BP (!) 175/73 (BP Location: Right Arm)   Pulse 65   Temp 98.6 F (37 C) (Oral)   Resp 18   SpO2 97%   Visual Acuity Right Eye Distance:   Left Eye Distance:   Bilateral Distance:    Right Eye Near:   Left Eye Near:    Bilateral Near:     Physical Exam Vitals and nursing note reviewed.  Constitutional:      General: He is awake. He  is not in acute distress.    Appearance: Normal appearance. He is well-developed and well-groomed. He is not ill-appearing.  HENT:     Right Ear: Tympanic membrane, ear canal and external ear normal.     Left Ear: Tympanic membrane, ear canal and external ear normal.     Nose: Congestion present.     Mouth/Throat:     Mouth: Mucous membranes are moist.     Dentition: Normal dentition. No dental tenderness, gingival swelling, dental caries, dental abscesses or gum lesions.     Tongue: No lesions.     Palate: No lesions.     Pharynx: Posterior oropharyngeal erythema and postnasal drip present. No oropharyngeal exudate.  Cardiovascular:     Rate and Rhythm: Normal rate and regular rhythm.  Pulmonary:     Effort: Pulmonary effort is normal.     Breath sounds: Normal breath sounds.  Skin:    General: Skin is warm and dry.  Neurological:     Mental Status: He is alert.  Psychiatric:        Behavior: Behavior is cooperative.      UC Treatments / Results  Labs (all labs ordered are listed, but only abnormal results are displayed) Labs Reviewed  POC COVID19/FLU A&B COMBO    EKG   Radiology DG Chest 2 View Result Date: 02/23/2024 EXAM: 2 VIEW(S) XRAY OF THE CHEST 02/23/2024 05:47:17 PM COMPARISON: 12/12/2022 CLINICAL HISTORY: productive cough, chest congestion FINDINGS: LUNGS AND PLEURA: No focal pulmonary opacity. No pulmonary edema. No pleural effusion. No pneumothorax. HEART AND MEDIASTINUM: Aortic arch calcifications. Borderline cardiomegaly. BONES AND SOFT TISSUES: Mild thoracic spine degenerative changes. No acute osseous abnormality. IMPRESSION: 1. No acute cardiopulmonary process. 2. Borderline cardiomegaly. Electronically signed by: Rockey Kilts MD 02/23/2024 06:24 PM EST RP Workstation: [id number]    Procedures Procedures (including critical care time)  Medications Ordered in UC Medications - No data to display  Initial Impression / Assessment and Plan / UC Course  I  have reviewed the triage vital signs and the nursing notes.  Pertinent labs & imaging results that were available during my care of the patient were reviewed by me and considered in my medical decision making (see chart for details).     Patient is overall well-appearing.  Vitals  are stable.  Blood pressure is elevated at 175/73, patient states that he has been told in the past that he has high blood pressure but is not currently taking medication for this.  Congestion is present, mild erythema and PND noted to posterior oropharynx.  Heart and lung sounds normal.  No other significant findings on exam.  No concerning findings noted to the mouth or teeth at this time.  COVID and flu testing negative.  Chest x-ray ordered to rule out underlying pneumonia.  I independently interpreted these images and there is no active cardiopulmonary disease.  Radiology report confirms this.  Symptoms likely viral in nature.  Prescribed Tessalon as needed for cough.  Discussed over-the-counter medications as needed for symptoms.  Recommended following up with dentist for dental concerns.  Given information for [facility] for further evaluation of his blood pressure and any other ongoing chronic concerns.  Discussed follow-up and return precautions. Final Clinical Impressions(s) / UC Diagnoses   Final diagnoses:  Acute cough  Viral respiratory illness  Elevated blood pressure reading     Discharge Instructions      Your COVID and flu testing are both negative today.  Your chest x-ray did not reveal any signs of underlying pneumonia. I believe your symptoms are likely related to a viral respiratory illness. You can take Tessalon every 8 hours as needed for cough. Otherwise you can take over-the-counter Coricidin for cough and congestion.  This medication is safe for any history of high blood pressure. You can follow-up with [facility] to get established with a primary  care provider. I have also attached the mobile health schedules that you can follow-up with for further evaluation of your blood pressure if needed. I recommend following up with your dentist for any continued dental concerns so they can evaluate this properly. Return here as needed.     ED Prescriptions     Medication Sig Dispense Auth. Provider   benzonatate (TESSALON) 100 MG capsule Take 1 capsule (100 mg total) by mouth every 8 (eight) hours. 21 capsule Johnie Flaming A, NP      PDMP not reviewed this encounter.   Johnie Flaming A, NP 02/23/24 1941

## 2024-02-23 NOTE — Discharge Instructions (Addendum)
 Your COVID and flu testing are both negative today.  Your chest x-ray did not reveal any signs of underlying pneumonia. I believe your symptoms are likely related to a viral respiratory illness. You can take Tessalon every 8 hours as needed for cough. Otherwise you can take over-the-counter Coricidin for cough and congestion.  This medication is safe for any history of high blood pressure. You can follow-up with [facility] to get established with a primary care provider. I have also attached the mobile health schedules that you can follow-up with for further evaluation of your blood pressure if needed. I recommend following up with your dentist for any continued dental concerns so they can evaluate this properly. Return here as needed.

## 2024-02-25 ENCOUNTER — Ambulatory Visit (INDEPENDENT_AMBULATORY_CARE_PROVIDER_SITE_OTHER)

## 2024-02-25 ENCOUNTER — Encounter (HOSPITAL_COMMUNITY): Payer: Self-pay

## 2024-02-25 ENCOUNTER — Ambulatory Visit (HOSPITAL_COMMUNITY): Admission: EM | Admit: 2024-02-25 | Discharge: 2024-02-25 | Disposition: A | Discharge status: Home / Self Care

## 2024-02-25 DIAGNOSIS — S161XXD Strain of muscle, fascia and tendon at neck level, subsequent encounter: Secondary | ICD-10-CM

## 2024-02-25 MED ORDER — DICLOFENAC SODIUM 50 MG PO TBEC: 50.0000 mg | DELAYED_RELEASE_TABLET | Freq: Two times a day (BID) | ORAL | 1 refills | Status: AC

## 2024-02-25 NOTE — ED Triage Notes (Signed)
 Patient here today with c/o neck pain and stiffness for months. Patient states that it doesn't hurt that much but is more worried about the cracking. Patient plays tennis.

## 2024-02-25 NOTE — Discharge Instructions (Addendum)
  1. Cervical muscle strain, subsequent encounter (Primary) - DG Cervical Spine Complete x-ray completed in UC shows no obvious fracture or dislocation, possible degenerative changes consistent with osteoarthritis. - diclofenac (VOLTAREN) 50 MG EC tablet; Take 1 tablet (50 mg total) by mouth 2 (two) times daily.  Dispense: 30 tablet; Refill: 1 - Apply heat to the area 2-3 times a day for 10 to 15 minutes at a time to decrease muscle spasm and increase muscle movement. -Continue to monitor symptoms for any change in severity if there is any escalation of current symptoms or development of new symptoms follow-up in ER for further evaluation and management.

## 2024-02-25 NOTE — ED Provider Notes (Signed)
 [facility] [place]  Note:  This document was prepared using Conservation officer, historic buildings and may include unintentional dictation errors.  MRN: [id number] DOB: [birth year]  Subjective:   Troy Salinas is a 89 y.o. male presenting for left-sided neck stiffness and pain for several months.  Patient reports it does not hurt too often however every once in a while when turning his head to the side he feels cracking in his neck which concerns him.  Patient reports he plays tennis regularly and is very active.  Patient denies that pain or stiffness has minimized his activity level.  Patient is not taking any medication over-the-counter to treat symptoms prior to arrival in urgent care today.  No current facility-administered medications for this encounter.  Current Outpatient Medications:    diclofenac (VOLTAREN) 50 MG EC tablet, Take 1 tablet (50 mg total) by mouth 2 (two) times daily., Disp: 30 tablet, Rfl: 1   benzonatate (TESSALON) 100 MG capsule, Take 1 capsule (100 mg total) by mouth every 8 (eight) hours., Disp: 21 capsule, Rfl: 0   dorzolamide-timolol (COSOPT) 2-0.5 % ophthalmic solution, , Disp: , Rfl:    ketoconazole  (NIZORAL ) 2 % cream, Apply 1 Application topically 2 (two) times daily., Disp: 60 g, Rfl: 2   latanoprost (XALATAN) 0.005 % ophthalmic solution, Place 1 drop into both eyes at bedtime., Disp: , Rfl:    mineral oil-hydrophilic petrolatum (AQUAPHOR) ointment, Apply topically as needed for dry skin., Disp: 420 g, Rfl: 1   triamcinolone  cream (KENALOG ) 0.1 %, Apply 1 Application topically 2 (two) times daily., Disp: 30 g, Rfl: 0   No Known Allergies  Past Medical History:  Diagnosis Date   Hyperlipidemia    Prostate cancer (HCC)    Tinnitus      Past Surgical History:  Procedure Laterality Date   COLONOSCOPY  2013   HERNIA REPAIR     had 2   TONSILECTOMY, ADENOIDECTOMY, BILATERAL MYRINGOTOMY AND TUBES  1942    Family History  Problem Relation Age of  Onset   Healthy Mother    Cataracts Mother    Bone cancer Sister    Colon cancer Sister    Colon cancer Brother    Cancer Maternal Grandmother    Healthy Maternal Grandfather    Healthy Paternal Grandmother    Healthy Paternal Grandfather     Social History   Tobacco Use   Smoking status: Former   Smokeless tobacco: Never  Advertising Account Planner   Vaping status: Never Used  Substance Use Topics   Alcohol  use: Yes    Comment: rarely   Drug use: Never    ROS Refer to HPI for ROS details.  Objective:    Vitals: BP (!) 190/80 (BP Location: Left Arm)   Pulse (!) 58   Temp 98.1 F (36.7 C) (Oral)   Resp 16   SpO2 97%   Physical Exam Vitals and nursing note reviewed.  Constitutional:      General: He is not in acute distress.    Appearance: Normal appearance. He is well-developed. He is not ill-appearing or toxic-appearing.  HENT:     Head: Normocephalic.  Cardiovascular:     Rate and Rhythm: Normal rate.  Pulmonary:     Effort: Pulmonary effort is normal. No respiratory distress.     Breath sounds: No stridor. No wheezing.  Musculoskeletal:     Cervical back: Neck supple. Torticollis and crepitus present. No rigidity or tenderness. Muscular tenderness present. No pain with movement or spinous process tenderness.  Decreased range of motion.  Skin:    General: Skin is warm and dry.  Neurological:     General: No focal deficit present.     Mental Status: He is alert and oriented to person, place, and time.  Psychiatric:        Mood and Affect: Mood normal.        Behavior: Behavior normal.     Procedures  No results found for this or any previous visit (from the past 24 hours).  Assessment and Plan :     Discharge Instructions       1. Cervical muscle strain, subsequent encounter (Primary) - DG Cervical Spine Complete x-ray completed in UC shows no obvious fracture or dislocation, possible degenerative changes consistent with osteoarthritis. - diclofenac  (VOLTAREN) 50 MG EC tablet; Take 1 tablet (50 mg total) by mouth 2 (two) times daily.  Dispense: 30 tablet; Refill: 1 - Apply heat to the area 2-3 times a day for 10 to 15 minutes at a time to decrease muscle spasm and increase muscle movement. -Continue to monitor symptoms for any change in severity if there is any escalation of current symptoms or development of new symptoms follow-up in ER for further evaluation and management.     Reef Achterberg B Shoni Quijas   Eliyanna Ault, [place] B, [place] 02/25/24 1259

## 2024-02-26 ENCOUNTER — Ambulatory Visit (HOSPITAL_COMMUNITY): Payer: Self-pay

## 2024-04-04 ENCOUNTER — Ambulatory Visit (HOSPITAL_COMMUNITY)
Admission: EM | Admit: 2024-04-04 | Discharge: 2024-04-04 | Disposition: A | Discharge status: Home / Self Care | Attending: Family Medicine | Admitting: Family Medicine

## 2024-04-04 DIAGNOSIS — I10 Essential (primary) hypertension: Secondary | ICD-10-CM

## 2024-04-04 DIAGNOSIS — M5382 Other specified dorsopathies, cervical region: Secondary | ICD-10-CM

## 2024-04-04 NOTE — ED Provider Notes (Signed)
 [facility]   [id number] 04/04/24 Arrival Time: 1007  ASSESSMENT & PLAN:  1. Elevated blood pressure reading with diagnosis of hypertension   2. Clicking neck    More specifically has questions re: diclofenac , tessalon  perles, and fluconazole . No need for these now. Continue current BP medication.  May f/u with PCP.  Denies neck pain. Reassured. No further evaluation needed at this time.  Reviewed expectations re: course of current medical issues. Questions answered. Outlined signs and symptoms indicating need for more acute intervention. Understanding verbalized. After Visit Summary given.   SUBJECTIVE: History from: Patient. Troy Salinas is a 89 y.o. male. Patient presents today for medication reconciliation. Patient also complains of clicking in his neck, pt reports that it has been going on for a while. Patient had imaging in November but continues to have symptoms. Patient is physically active. Patient denies any SOB or chest pain. Brings Rx diclofenac , tessalon  perles, fluconazole . H/O HTN. Taking amlodipine.  OBJECTIVE:  Vitals:   04/04/24 1022  BP: (!) 152/78  Pulse: 60  Resp: 16  Temp: 97.6 F (36.4 C)  TempSrc: Oral  SpO2: 96%    General appearance: alert; no distress Eyes: PERRLA; EOMI; conjunctivae normal Neck: supple with FROM; no audible clicking; denies vertebral TTP Skin: warm and dry Psychological: alert and cooperative; normal mood and affect  Labs:  Labs Reviewed - No data to display  Imaging: No results found.  Allergies[1]  Past Medical History:  Diagnosis Date   Hyperlipidemia    Prostate cancer (HCC)    Tinnitus    Social History   Socioeconomic History   Marital status: Divorced    Spouse name: Not on file   Number of children: 1   Years of education: 16   Highest education level: Not on file  Occupational History   Occupation: Retired  Tobacco Use   Smoking status: Former   Smokeless tobacco: Never   Advertising Account Planner   Vaping status: Never Used  Substance and Sexual Activity   Alcohol  use: Yes    Comment: rarely   Drug use: Never   Sexual activity: Not Currently  Other Topics Concern   Not on file  Social History Narrative   Fun: Plays tennis   Feels safe at home and denies abuse   Social Drivers of Health   Tobacco Use: Medium Risk (02/25/2024)   Patient History    Smoking Tobacco Use: Former    Smokeless Tobacco Use: Never    Passive Exposure: Not on Actuary Strain: Not on file  Food Insecurity: Not on file  Transportation Needs: Not on file  Physical Activity: Not on file  Stress: Not on file  Social Connections: Not on file  Intimate Partner Violence: Not on file  Depression (PHQ2-9): Not on file  Alcohol  Screen: Not on file  Housing: Not on file  Utilities: Not on file  Health Literacy: Not on file   Family History  Problem Relation Age of Onset   Healthy Mother    Cataracts Mother    Bone cancer Sister    Colon cancer Sister    Colon cancer Brother    Cancer Maternal Grandmother    Healthy Maternal Grandfather    Healthy Paternal Grandmother    Healthy Paternal Grandfather    Past Surgical History:  Procedure Laterality Date   COLONOSCOPY  2013   HERNIA REPAIR     had 2   TONSILECTOMY, ADENOIDECTOMY, BILATERAL MYRINGOTOMY AND TUBES  1942      [  1] No Known Allergies    Rolinda Rogue, MD 04/04/24 1130

## 2024-04-04 NOTE — ED Triage Notes (Addendum)
 Patient presents today for medication reconciliation. Patient also complains of clicking in his neck, pt reports that it has been going on for a while. Patient had imaging in November but continues to have symptoms. Patient is physically active. Patient denies any SOB or chest pain.

## 2024-04-14 ENCOUNTER — Encounter (HOSPITAL_COMMUNITY): Payer: Self-pay | Admitting: Emergency Medicine

## 2024-04-14 ENCOUNTER — Ambulatory Visit (HOSPITAL_COMMUNITY): Admission: EM | Admit: 2024-04-14 | Discharge: 2024-04-14 | Disposition: A | Discharge status: Home / Self Care

## 2024-04-14 DIAGNOSIS — R600 Localized edema: Secondary | ICD-10-CM | POA: Diagnosis not present

## 2024-04-14 NOTE — ED Provider Notes (Signed)
 " [facility]    CSN: [id number] Arrival date & time: 04/14/24  1147      History   Chief Complaint Chief Complaint  Patient presents with   Swelling in ankles and feet    HPI Troy Salinas is a 89 y.o. male.   Patient presents today with 4-day history of bilateral lower extremity swelling around his ankles and in his feet.  Denies recent fall, trauma, injury to lower extremities.  No pain associated with the swelling.  Reports symptoms have slowly improved with the past few days as he has been elevating his feet.  He denies chest pain, shortness of breath, lightheadedness or dizziness, and vision changes.  Reports in general, he does try to watch his diet and avoids added salt and sugar but with the recent holiday may have treated himself to pinto beans and canned tomatoes.  He takes amlodipine for blood pressure daily.  Does not currently have a primary care provider.    Past Medical History:  Diagnosis Date   Hyperlipidemia    Prostate cancer [facility])    Tinnitus     Patient Active Problem List   Diagnosis Date Noted   Glaucoma 12/08/2017   Skin macule 11/26/2014   Cataracts, bilateral 11/26/2014   Junctional rhythm 09/05/2014   ADENOCARCINOMA, PROSTATE 10/23/2009   HYPERTROPHY PROSTATE W/O UR OBST & OTH LUTS 05/25/2009    Past Surgical History:  Procedure Laterality Date   COLONOSCOPY  2013   HERNIA REPAIR     had 2   TONSILECTOMY, ADENOIDECTOMY, BILATERAL MYRINGOTOMY AND TUBES  1942       Home Medications    Prior to Admission medications  Medication Sig Start Date End Date Taking? Authorizing Provider  amLODipine (NORVASC) 10 MG tablet Take 10 mg by mouth daily. 03/08/24  Yes [provider]  benzonatate  (TESSALON ) 100 MG capsule Take 1 capsule (100 mg total) by mouth every 8 (eight) hours. 02/23/24   Johnie Flaming A, NP  diclofenac  (VOLTAREN ) 50 MG EC tablet Take 1 tablet (50 mg total) by mouth 2 (two) times daily. 02/25/24   Reddick,  Johnathan B, NP  dorzolamide-timolol (COSOPT) 2-0.5 % ophthalmic solution  09/23/19   [provider]  fluconazole  (DIFLUCAN ) 200 MG tablet Take 200 mg by mouth once a week. 01/01/24   [provider]  ketoconazole  (NIZORAL ) 2 % cream Apply 1 Application topically 2 (two) times daily. 11/10/22   McDonald, Juliene SAUNDERS, DPM  latanoprost (XALATAN) 0.005 % ophthalmic solution Place 1 drop into both eyes at bedtime. 11/15/18   [provider]  mineral oil-hydrophilic petrolatum (AQUAPHOR) ointment Apply topically as needed for dry skin. 03/04/23   Raspet, Erin K, PA-C  triamcinolone  cream (KENALOG ) 0.1 % Apply 1 Application topically 2 (two) times daily. 11/03/22   Raspet, Rocky POUR, PA-C    Family History Family History  Problem Relation Age of Onset   Healthy Mother    Cataracts Mother    Bone cancer Sister    Colon cancer Sister    Colon cancer Brother    Cancer Maternal Grandmother    Healthy Maternal Grandfather    Healthy Paternal Grandmother    Healthy Paternal Grandfather     Social History Social History[1]   Allergies   Patient has no known allergies.   Review of Systems Review of Systems Per HPI  Physical Exam Triage Vital Signs ED Triage Vitals  Encounter Vitals Group     BP 04/14/24 1344 (!) 147/79  Girls Systolic BP Percentile --      Girls Diastolic BP Percentile --      Boys Systolic BP Percentile --      Boys Diastolic BP Percentile --      Pulse Rate 04/14/24 1344 (!) 55     Resp 04/14/24 1344 18     Temp 04/14/24 1344 (!) 97.2 F (36.2 C)     Temp Source 04/14/24 1344 Oral     SpO2 04/14/24 1344 98 %     Weight 04/14/24 1342 155 lb (70.3 kg)     Height 04/14/24 1342 5' 9 (1.753 m)     Head Circumference --      Peak Flow --      Pain Score 04/14/24 1342 0     Pain Loc --      Pain Education --      Exclude from Growth Chart --    No data found.  Updated Vital Signs BP (!) 147/79 (BP Location: Left Arm)   Pulse (!) 55    Temp (!) 97.2 F (36.2 C) (Oral)   Resp 18   Ht 5' 9 (1.753 m)   Wt 155 lb (70.3 kg)   SpO2 98%   BMI 22.89 kg/m   Visual Acuity Right Eye Distance:   Left Eye Distance:   Bilateral Distance:    Right Eye Near:   Left Eye Near:    Bilateral Near:     Physical Exam Vitals and nursing note reviewed.  Constitutional:      General: He is not in acute distress.    Appearance: Normal appearance. He is not toxic-appearing.  HENT:     Mouth/Throat:     Mouth: Mucous membranes are moist.     Pharynx: Oropharynx is clear.  Pulmonary:     Effort: Pulmonary effort is normal. No respiratory distress.  Musculoskeletal:     Right lower leg: 2+ Pitting Edema present.     Left lower leg: 2+ Pitting Edema present.  Skin:    General: Skin is warm and dry.     Capillary Refill: Capillary refill takes less than 2 seconds.     Coloration: Skin is not jaundiced or pale.     Findings: No erythema or rash.  Neurological:     Mental Status: He is alert and oriented to person, place, and time.  Psychiatric:        Behavior: Behavior is cooperative.      UC Treatments / Results  Labs (all labs ordered are listed, but only abnormal results are displayed) Labs Reviewed - No data to display  EKG   Radiology No results found.  Procedures Procedures (including critical care time)  Medications Ordered in UC Medications - No data to display  Initial Impression / Assessment and Plan / UC Course  I have reviewed the triage vital signs and the nursing notes.  Pertinent labs & imaging results that were available during my care of the patient were reviewed by me and considered in my medical decision making (see chart for details).   Patient is a very pleasant, well-appearing 89 year old male presenting today for bilateral lower extremity swelling.  He denies any associated symptoms and vital signs are stable.  On exam, there is pitting edema bilateral lower extremities.  He reports this  has improved over the last few days.  I recommended compression stockings, elevation, increasing hydration with water, and avoiding added salt to foods.  Strict ER precautions were discussed with the  patient.  Blood work deferred at this time but encouraged him to return if symptoms persist despite treatment.  Recommended establishing care and follow up with PCP.  The patient was given the opportunity to ask questions.  All questions answered to their satisfaction.  The patient is in agreement to this plan.   Final Clinical Impressions(s) / UC Diagnoses   Final diagnoses:  Bilateral lower extremity edema     Discharge Instructions      Avoid food products with added salt to help with lower extremity swelling.  Also recommend compression stockings while awake and walking around during the day and elevation when sitting down and at night time.  Seek care if you develop chest pain or shortness of breath associated with the lower extremity swelling.   ED Prescriptions   None    PDMP not reviewed this encounter.    [1]  Social History Tobacco Use   Smoking status: Former   Smokeless tobacco: Never  Vaping Use   Vaping status: Never Used  Substance Use Topics   Alcohol  use: Yes    Comment: rarely   Drug use: Never     Chandra Harlene LABOR, NP 04/14/24 1428  "

## 2024-04-14 NOTE — Discharge Instructions (Signed)
 Avoid food products with added salt to help with lower extremity swelling.  Also recommend compression stockings while awake and walking around during the day and elevation when sitting down and at night time.  Seek care if you develop chest pain or shortness of breath associated with the lower extremity swelling.

## 2024-04-14 NOTE — ED Triage Notes (Signed)
 Patient c/o bilateral feet and ankle swelling x 4 days.  No redness.  Denies any heart conditions or SOB.  Patient has not been doing any elevation of his extremities.

## 2024-04-25 ENCOUNTER — Encounter (HOSPITAL_COMMUNITY): Payer: Self-pay

## 2024-04-25 ENCOUNTER — Ambulatory Visit (HOSPITAL_COMMUNITY)
Admission: EM | Admit: 2024-04-25 | Discharge: 2024-04-25 | Disposition: A | Discharge status: Home / Self Care | Attending: Emergency Medicine | Admitting: Emergency Medicine

## 2024-04-25 DIAGNOSIS — R0981 Nasal congestion: Secondary | ICD-10-CM

## 2024-04-25 DIAGNOSIS — R1084 Generalized abdominal pain: Secondary | ICD-10-CM | POA: Diagnosis not present

## 2024-04-25 LAB — POC SOFIA SARS ANTIGEN FIA: SARS Coronavirus 2 Ag: NEGATIVE

## 2024-04-25 MED ORDER — AZELASTINE HCL 0.1 % NA SOLN: 2.0000 | Freq: Two times a day (BID) | NASAL | 0 refills | Status: AC

## 2024-04-25 NOTE — ED Provider Notes (Signed)
 " [facility]    CSN: [id number] Arrival date & time: 04/25/24  9071      History   Chief Complaint Chief Complaint  Patient presents with   Nasal Congestion   Abdominal Pain   watery eyes    HPI Troy Salinas is a 89 y.o. male.   Patient presents with nasal congestion, runny nose, and occasionally watering eyes that began 3 to 4 days ago.  Patient states that he also has an occasional cough.  Additionally patient reports intermittent mild generalized abdominal pain that began about 3 or 4 days ago as well.  Patient denies any fever, nausea, vomiting, diarrhea, and constipation.  Patient denies taking medication for symptoms.  Patient reports that he does not currently have a primary care provider.  Patient does report a history of prostate cancer and is concerned that he may have colon cancer due to his new onset of intermittent abdominal pain.  Patient denies any weakness, dizziness, lightheadedness, blood in stool, or difficulty having bowel movements.  Patient denies any known sick exposures.  Nursing staff had mentioned some concerns about patient's current living situation.  They were under the impression that those were living with him where taking advantage of his resources and money and the patient was upset about this.  However once I spoke with patient and clarified this he reported that he has had some issues with a recent car company, due to getting treated, and another company for over charging him and this is what he was upset about financially.  Patient denies any concerns for his safety at home and reports that those living with him are not taking advantage of his resources and money.  The history is provided by the patient and medical records.  Abdominal Pain   Past Medical History:  Diagnosis Date   Hyperlipidemia    Prostate cancer [facility])    Tinnitus     Patient Active Problem List   Diagnosis Date Noted   Glaucoma 12/08/2017   Skin macule 11/26/2014    Cataracts, bilateral 11/26/2014   Junctional rhythm 09/05/2014   ADENOCARCINOMA, PROSTATE 10/23/2009   HYPERTROPHY PROSTATE W/O UR OBST & OTH LUTS 05/25/2009    Past Surgical History:  Procedure Laterality Date   COLONOSCOPY  2013   HERNIA REPAIR     had 2   TONSILECTOMY, ADENOIDECTOMY, BILATERAL MYRINGOTOMY AND TUBES  1942       Home Medications    Prior to Admission medications  Medication Sig Start Date End Date Taking? Authorizing Provider  azelastine  (ASTELIN ) 0.1 % nasal spray Place 2 sprays into both nostrils 2 (two) times daily. Use in each nostril as directed 04/25/24  Yes Johnie Flaming A, NP  amLODipine (NORVASC) 10 MG tablet Take 10 mg by mouth daily. 03/08/24   [provider]  benzonatate  (TESSALON ) 100 MG capsule Take 1 capsule (100 mg total) by mouth every 8 (eight) hours. Patient not taking: Reported on 04/25/2024 02/23/24   Johnie Flaming A, NP  diclofenac  (VOLTAREN ) 50 MG EC tablet Take 1 tablet (50 mg total) by mouth 2 (two) times daily. 02/25/24   Reddick, Johnathan B, NP  dorzolamide-timolol (COSOPT) 2-0.5 % ophthalmic solution  09/23/19   [provider]  fluconazole  (DIFLUCAN ) 200 MG tablet Take 200 mg by mouth once a week. Patient not taking: Reported on 04/25/2024 01/01/24   [provider]  ketoconazole  (NIZORAL ) 2 % cream Apply 1 Application topically 2 (two) times daily. Patient not taking: Reported on 04/25/2024  11/10/22   McDonald, Juliene SAUNDERS, DPM  latanoprost (XALATAN) 0.005 % ophthalmic solution Place 1 drop into both eyes at bedtime. 11/15/18   [provider]  mineral oil-hydrophilic petrolatum (AQUAPHOR) ointment Apply topically as needed for dry skin. 03/04/23   Raspet, Erin K, PA-C  triamcinolone  cream (KENALOG ) 0.1 % Apply 1 Application topically 2 (two) times daily. 11/03/22   Raspet, Rocky POUR, PA-C    Family History Family History  Problem Relation Age of Onset   Healthy Mother    Cataracts Mother    Bone cancer  Sister    Colon cancer Sister    Colon cancer Brother    Cancer Maternal Grandmother    Healthy Maternal Grandfather    Healthy Paternal Grandmother    Healthy Paternal Grandfather     Social History Social History[1]   Allergies   Patient has no known allergies.   Review of Systems Review of Systems  Gastrointestinal:  Positive for abdominal pain.   Per HPI  Physical Exam Triage Vital Signs ED Triage Vitals [04/25/24 1001]  Encounter Vitals Group     BP 120/69     Girls Systolic BP Percentile      Girls Diastolic BP Percentile      Boys Systolic BP Percentile      Boys Diastolic BP Percentile      Pulse Rate 79     Resp 16     Temp 98.3 F (36.8 C)     Temp Source Oral     SpO2 98 %     Weight      Height      Head Circumference      Peak Flow      Pain Score 0     Pain Loc      Pain Education      Exclude from Growth Chart    No data found.  Updated Vital Signs BP 120/69 (BP Location: Left Arm)   Pulse 79   Temp 98.3 F (36.8 C) (Oral)   Resp 16   SpO2 98%   Visual Acuity Right Eye Distance:   Left Eye Distance:   Bilateral Distance:    Right Eye Near:   Left Eye Near:    Bilateral Near:     Physical Exam Vitals and nursing note reviewed.  Constitutional:      General: He is awake. He is not in acute distress.    Appearance: Normal appearance. He is well-developed and well-groomed. He is not ill-appearing.  HENT:     Right Ear: Tympanic membrane, ear canal and external ear normal.     Left Ear: Tympanic membrane, ear canal and external ear normal.     Nose: Congestion and rhinorrhea present.     Mouth/Throat:     Mouth: Mucous membranes are moist.     Pharynx: No oropharyngeal exudate or posterior oropharyngeal erythema.  Cardiovascular:     Rate and Rhythm: Normal rate and regular rhythm.  Pulmonary:     Effort: Pulmonary effort is normal.     Breath sounds: Normal breath sounds.  Abdominal:     General: Abdomen is flat. Bowel  sounds are normal.     Palpations: Abdomen is soft.     Tenderness: There is generalized abdominal tenderness. There is no right CVA tenderness, left CVA tenderness, guarding or rebound. Negative signs include Murphy's sign, Rovsing's sign and McBurney's sign.     Comments: Mild generalized abdominal tenderness without guarding or rebound tenderness.  Skin:  General: Skin is warm and dry.  Neurological:     Mental Status: He is alert.  Psychiatric:        Behavior: Behavior is cooperative.      UC Treatments / Results  Labs (all labs ordered are listed, but only abnormal results are displayed) Labs Reviewed  POC SOFIA SARS ANTIGEN FIA    EKG   Radiology No results found.  Procedures Procedures (including critical care time)  Medications Ordered in UC Medications - No data to display  Initial Impression / Assessment and Plan / UC Course  I have reviewed the triage vital signs and the nursing notes.  Pertinent labs & imaging results that were available during my care of the patient were reviewed by me and considered in my medical decision making (see chart for details).     Patient is overall well-appearing.  Vitals are stable.  Mild generalized abdominal tenderness noted without guarding or rebound tenderness.  Prescribed nasal spray to help with nasal congestion.  Recommended following up with a primary care provider for further evaluation of symptoms.  Asked clinical staff to schedule PCP appointment for patient today.  Discussed follow-up, return, and strict ER precautions. Final Clinical Impressions(s) / UC Diagnoses   Final diagnoses:  Nasal congestion  Intermittent generalized abdominal pain     Discharge Instructions      Your COVID testing is negative.  I discussed open your symptoms could possibly related to a viral illness due to sudden onset. I prescribed azelastine  nasal spray that you can use twice daily to help with nasal congestion and runny  nose. Please follow-up with your primary care provider that you are established with today for further evaluation and management of your ongoing symptoms and concerns. If you develop severe abdominal pain, excessive vomiting, blood in vomit or stool, high fevers, severe weakness, or passing out please seek immediate medical treatment in the emergency department.     ED Prescriptions     Medication Sig Dispense Auth. Provider   azelastine  (ASTELIN ) 0.1 % nasal spray Place 2 sprays into both nostrils 2 (two) times daily. Use in each nostril as directed 30 mL Johnie Flaming A, NP      PDMP not reviewed this encounter.     [1]  Social History Tobacco Use   Smoking status: Former   Smokeless tobacco: Never  Vaping Use   Vaping status: Never Used  Substance Use Topics   Alcohol  use: Not Currently    Comment: rarely   Drug use: Never     Johnie Flaming LABOR, NP 04/25/24 1115  "

## 2024-04-25 NOTE — Discharge Instructions (Signed)
 Your COVID testing is negative.  I discussed open your symptoms could possibly related to a viral illness due to sudden onset. I prescribed azelastine  nasal spray that you can use twice daily to help with nasal congestion and runny nose. Please follow-up with your primary care provider that you are established with today for further evaluation and management of your ongoing symptoms and concerns. If you develop severe abdominal pain, excessive vomiting, blood in vomit or stool, high fevers, severe weakness, or passing out please seek immediate medical treatment in the emergency department.

## 2024-04-25 NOTE — ED Triage Notes (Signed)
 Patient c/o watery eyes, nasal congestion, and intermittent abdominal pain x 2 weeks.  Patient states he hs not taken any medication for his symptoms.
